# Patient Record
Sex: Female | Born: 1935 | Race: Black or African American | Hispanic: No | Smoking: Never smoker
Health system: Southern US, Community
[De-identification: ages and names within clinical notes are randomized; demographics above are authoritative.]

## PROBLEM LIST (undated history)

## (undated) DIAGNOSIS — E119 Type 2 diabetes mellitus without complications: Secondary | ICD-10-CM

## (undated) DIAGNOSIS — G4730 Sleep apnea, unspecified: Secondary | ICD-10-CM

## (undated) HISTORY — DX: Sleep apnea, unspecified: G47.30

## (undated) HISTORY — PX: PARTIAL HYSTERECTOMY: SHX80

## (undated) HISTORY — PX: BREAST EXCISIONAL BIOPSY: SUR124

## (undated) HISTORY — DX: Type 2 diabetes mellitus without complications: E11.9

## (undated) HISTORY — PX: KNEE SURGERY: SHX244

## (undated) HISTORY — PX: HERNIA REPAIR: SHX51

## (undated) HISTORY — PX: WRIST SURGERY: SHX841

---

## 2004-10-30 ENCOUNTER — Ambulatory Visit: Payer: Self-pay | Admitting: Unknown Physician Specialty

## 2004-12-27 ENCOUNTER — Ambulatory Visit: Payer: Self-pay | Admitting: Internal Medicine

## 2006-05-29 ENCOUNTER — Ambulatory Visit: Payer: Self-pay | Admitting: Internal Medicine

## 2007-03-02 ENCOUNTER — Other Ambulatory Visit: Payer: Self-pay

## 2007-03-02 ENCOUNTER — Emergency Department: Payer: Self-pay | Admitting: Emergency Medicine

## 2007-09-11 ENCOUNTER — Ambulatory Visit: Payer: Self-pay | Admitting: Internal Medicine

## 2008-09-16 ENCOUNTER — Ambulatory Visit: Payer: Self-pay | Admitting: Internal Medicine

## 2009-08-30 ENCOUNTER — Ambulatory Visit: Payer: Self-pay | Admitting: Cardiology

## 2010-04-03 ENCOUNTER — Ambulatory Visit: Payer: Self-pay | Admitting: Internal Medicine

## 2010-06-08 ENCOUNTER — Ambulatory Visit: Payer: Self-pay | Admitting: Unknown Physician Specialty

## 2010-06-12 LAB — PATHOLOGY REPORT

## 2010-10-09 ENCOUNTER — Ambulatory Visit: Payer: Self-pay | Admitting: Internal Medicine

## 2010-11-07 ENCOUNTER — Ambulatory Visit: Payer: Self-pay | Admitting: Pain Medicine

## 2010-11-15 ENCOUNTER — Ambulatory Visit: Payer: Self-pay | Admitting: Pain Medicine

## 2010-12-12 ENCOUNTER — Ambulatory Visit: Payer: Self-pay | Admitting: Pain Medicine

## 2010-12-20 ENCOUNTER — Ambulatory Visit: Payer: Self-pay | Admitting: Pain Medicine

## 2011-01-18 ENCOUNTER — Ambulatory Visit: Payer: Self-pay | Admitting: Pain Medicine

## 2011-01-31 ENCOUNTER — Ambulatory Visit: Payer: Self-pay | Admitting: Pain Medicine

## 2011-03-08 ENCOUNTER — Ambulatory Visit: Payer: Self-pay | Admitting: Pain Medicine

## 2011-04-26 ENCOUNTER — Ambulatory Visit: Payer: Self-pay | Admitting: Pain Medicine

## 2011-05-29 ENCOUNTER — Ambulatory Visit: Payer: Self-pay | Admitting: Pain Medicine

## 2011-09-20 ENCOUNTER — Ambulatory Visit: Payer: Self-pay | Admitting: Internal Medicine

## 2013-01-20 ENCOUNTER — Ambulatory Visit: Payer: Self-pay | Admitting: Internal Medicine

## 2013-08-28 ENCOUNTER — Ambulatory Visit: Payer: Self-pay | Admitting: Internal Medicine

## 2013-09-19 ENCOUNTER — Ambulatory Visit: Payer: Self-pay | Admitting: Internal Medicine

## 2013-10-19 ENCOUNTER — Ambulatory Visit: Payer: Self-pay | Admitting: Internal Medicine

## 2014-01-22 ENCOUNTER — Ambulatory Visit: Payer: Self-pay | Admitting: Podiatry

## 2014-02-05 ENCOUNTER — Ambulatory Visit: Payer: Self-pay | Admitting: Podiatry

## 2014-03-02 ENCOUNTER — Encounter: Payer: Self-pay | Admitting: Podiatry

## 2014-03-02 ENCOUNTER — Ambulatory Visit (INDEPENDENT_AMBULATORY_CARE_PROVIDER_SITE_OTHER): Payer: No Typology Code available for payment source

## 2014-03-02 ENCOUNTER — Ambulatory Visit: Payer: No Typology Code available for payment source | Admitting: Podiatry

## 2014-03-02 VITALS — Resp 16 | Ht 64.0 in | Wt 260.0 lb

## 2014-03-02 DIAGNOSIS — Q828 Other specified congenital malformations of skin: Secondary | ICD-10-CM

## 2014-03-02 DIAGNOSIS — M79609 Pain in unspecified limb: Secondary | ICD-10-CM

## 2014-03-02 DIAGNOSIS — B351 Tinea unguium: Secondary | ICD-10-CM

## 2014-03-02 DIAGNOSIS — M79673 Pain in unspecified foot: Secondary | ICD-10-CM

## 2014-03-02 DIAGNOSIS — M2040 Other hammer toe(s) (acquired), unspecified foot: Secondary | ICD-10-CM

## 2014-03-02 DIAGNOSIS — E1159 Type 2 diabetes mellitus with other circulatory complications: Secondary | ICD-10-CM

## 2014-03-02 NOTE — Progress Notes (Signed)
   Subjective:    Patient ID: Jackie Tucker, female    DOB: 06/09/1934, 80 y.o.   MRN: [id number]  HPI Comments: N pain L trim toenails D yrs O slowly C worse A growing out T pt has someone to trim toenails    N 0 L 3RD DIGIT LEFT D ? O SLOWLY C WORSE A SHOES T NEOSPORIN, PEROXIDE, SOAKS IN WATER     Review of Systems  All other systems reviewed and are negative.      Objective:   Physical Exam        Assessment & Plan:

## 2014-03-03 ENCOUNTER — Encounter: Payer: Self-pay | Admitting: Podiatry

## 2014-03-03 NOTE — Progress Notes (Signed)
Subjective:     Patient ID: Jackie Tucker, female   DOB: 08/06/1934, 80 y.o.   MRN: [id number]  HPI patient presents for nail care and lesion third digit left become painful. She is a long-term diabetic and has had history of digital deformities and has at risk environment   Review of Systems     Objective:   Physical Exam Neurovascular status unchanged with thick nailbeds 1-5 both feet that are painful in keratotic lesion distal third left it's painful along with hammertoe deformity and diminishment of sharp toe vibratory and venous congestion noted within the ankle both feet along with diminished      Assessment:     At risk for diabetic with nail disease that's painful lesion third left and pretty ulcerated type environment    Plan:     Debridement of nailbeds 1-5 both feet with no iatrogenic bleeding noted and agreed lesion distal third left with loose and core with no bleeding noted and we will get authorization for diabetic shoes to help prevent further issues with this patient. Has worn them in the past and they have been helpful in preventing problems

## 2014-03-19 ENCOUNTER — Telehealth: Payer: Self-pay | Admitting: *Deleted

## 2014-03-19 NOTE — Telephone Encounter (Signed)
Called and left message with gentleman asking him to tell Huma to call dr regals office. Pt needs to make appt for diabetic shoes. Was approved by her doctor to have diabetic shoes.

## 2014-07-02 ENCOUNTER — Ambulatory Visit: Payer: No Typology Code available for payment source | Admitting: Podiatry

## 2014-07-21 ENCOUNTER — Other Ambulatory Visit: Payer: Self-pay | Admitting: Orthopedic Surgery

## 2014-07-21 DIAGNOSIS — M545 Low back pain, unspecified: Secondary | ICD-10-CM

## 2014-07-31 ENCOUNTER — Ambulatory Visit
Admission: RE | Admit: 2014-07-31 | Discharge: 2014-07-31 | Disposition: A | Discharge status: Home / Self Care | Payer: Medicare Other | Source: Ambulatory Visit | Attending: Orthopedic Surgery | Admitting: Orthopedic Surgery

## 2014-07-31 DIAGNOSIS — M545 Low back pain, unspecified: Secondary | ICD-10-CM

## 2014-11-30 ENCOUNTER — Ambulatory Visit: Payer: Self-pay | Admitting: Internal Medicine

## 2015-05-24 ENCOUNTER — Encounter: Payer: Medicare Other | Attending: Internal Medicine | Admitting: Respiratory Therapy

## 2015-05-24 VITALS — Ht 65.0 in | Wt 258.7 lb

## 2015-05-24 DIAGNOSIS — J449 Chronic obstructive pulmonary disease, unspecified: Secondary | ICD-10-CM | POA: Insufficient documentation

## 2015-05-24 DIAGNOSIS — G4730 Sleep apnea, unspecified: Secondary | ICD-10-CM

## 2015-05-24 NOTE — Progress Notes (Signed)
Pulmonary Individual Treatment Plan  Patient Details  Name: Jackie Tucker MRN: [id number] Date of Birth: July 04, 1934 Referring Provider:  Yevonne Pax, MD  Initial Encounter Date: Date: 05/24/15  Visit Diagnosis: COPD, mild  Sleep apnea  Patient's Home Medications on Admission:  Current outpatient prescriptions:  .  ALLOPURINOL PO, Take by mouth daily., Disp: , Rfl:  .  aspirin 81 MG tablet, Take 81 mg by mouth 2 (two) times daily., Disp: , Rfl:  .  atenolol (TENORMIN) 50 MG tablet, Take 50 mg by mouth daily., Disp: , Rfl:  .  ferrous sulfate 325 (65 FE) MG tablet, Take 325 mg by mouth daily with breakfast., Disp: , Rfl:  .  furosemide (LASIX) 40 MG tablet, Take 40 mg by mouth daily., Disp: , Rfl:  .  gabapentin (NEURONTIN) 300 MG capsule, Take 300 mg by mouth 3 (three) times daily., Disp: , Rfl:  .  losartan (COZAAR) 50 MG tablet, Take 50 mg by mouth daily., Disp: , Rfl:  .  omeprazole (PRILOSEC) 20 MG capsule, Take 20 mg by mouth 2 (two) times daily before a meal., Disp: , Rfl:  .  oxyCODONE-acetaminophen (PERCOCET/ROXICET) 5-325 MG per tablet, Take by mouth 2 (two) times daily., Disp: , Rfl:  .  PARoxetine (PAXIL) 20 MG tablet, Take 20 mg by mouth daily., Disp: , Rfl:   Past Medical History: Past Medical History  Diagnosis Date  . Diabetes   . Sleep apnea     Tobacco Use: History  Smoking status  . Never Smoker   Smokeless tobacco  . Not on file    Labs: Recent Review Flowsheet Data    There is no flowsheet data to display.       ADL UCSD:     ADL UCSD      05/24/15 1330       ADL UCSD   ADL Phase Entry     SOB Score total 40     Rest 0     Walk 0     Stairs 3     Bath 1     Dress 1     Shop 2         Pulmonary Function Assessment:     Pulmonary Function Assessment - 05/24/15 1446    Pulmonary Function Tests   RV% 91 %   DLCO% 58 %   Initial Spirometry Results   FVC% 67 %   FEV1% 79 %   FEV1/FVC Ratio 90   Post Bronchodilator  Spirometry Results   FVC% 81 %   FEV1% 93 %   FEV1/FVC Ratio 87   Breath   Shortness of Breath Yes;Fear of Shortness of Breath;Limiting activity      Exercise Target Goals: Date: 05/24/15  Exercise Program Goal: Individual exercise prescription set with THRR, safety & activity barriers. Participant demonstrates ability to understand and report RPE using BORG scale, to self-measure pulse accurately, and to acknowledge the importance of the exercise prescription.  Exercise Prescription Goal: Starting with aerobic activity 30 plus minutes a day, 3 days per week for initial exercise prescription. Provide home exercise prescription and guidelines that participant acknowledges understanding prior to discharge.  Activity Barriers & Risk Stratification:     Activity Barriers & Risk Stratification - 05/24/15 1330    Activity Barriers & Risk Stratification   Activity Barriers Shortness of Breath;Back Problems   Risk Stratification Low      6 Minute Walk:     6 Minute Walk  05/24/15 1526       6 Minute Walk   Phase Initial     Distance 400 feet     Walk Time 3 minutes     Resting HR 75 bpm     Resting BP 152/78 mmHg     Max Ex. HR 89 bpm     Max Ex. BP 222/72 mmHg     RPE 18     Perceived Dyspnea  6     Symptoms No        Initial Exercise Prescription:     Initial Exercise Prescription - 05/24/15 1500    Date of Initial Exercise Prescription   Date 05/24/15   Treadmill   MPH 1   Grade 0   Minutes 10   Recumbant Bike   Level 1   RPM 30   Watts 20   Minutes 10   NuStep   Level 2   Watts 40   Minutes 10   Arm Ergometer   Level 1   Watts 10   Minutes 10   REL-XR   Level 1   Watts 40   Minutes 10   Prescription Details   Frequency (times per week) 3   Duration Progress to 30 minutes of continuous aerobic without signs/symptoms of physical distress   Intensity   THRR REST +  30   Ratings of Perceived Exertion 11-15   Perceived Dyspnea 2-4    Progression Continue progressive overload as per policy without signs/symptoms or physical distress.   Resistance Training   Training Prescription Yes   Weight 1   Reps 10-12      Exercise Prescription Changes:   Discharge Exercise Prescription (Final Exercise Prescription Changes):    Nutrition:  Target Goals: Understanding of nutrition guidelines, daily intake of sodium 1500mg , cholesterol 200mg , calories 30% from fat and 7% or less from saturated fats, daily to have 5 or more servings of fruits and vegetables.  Biometrics:     Pre Biometrics - 05/24/15 1528    Pre Biometrics   Height 5\' 5"  (1.651 m)   Weight 258 lb 11.2 oz (117.346 kg)   Waist Circumference 46 inches   Hip Circumference 51.5 inches   Waist to Hip Ratio 0.89 %   BMI (Calculated) 43.1       Nutrition Therapy Plan and Nutrition Goals:     Nutrition Therapy & Goals - 05/24/15 1330    Nutrition Therapy   Diet Ms Robb prefers not to meet with the dietitian; she cooks healthy with vegetables, no salt; her goal is to weigh 175lbs      Nutrition Discharge: Rate Your Plate Scores:   Psychosocial: Target Goals: Acknowledge presence or absence of depression, maximize coping skills, provide positive support system. Participant is able to verbalize types and ability to use techniques and skills needed for reducing stress and depression.  Initial Review & Psychosocial Screening:     Initial Psych Review & Screening - 05/24/15 1330    Initial Review   Current issues with History of Depression   Family Dynamics   Good Support System? Yes   Comments Ms Kamphuis is very pleasant lady. She has been with a partner for 29 years. Her first husband died in the 57's. She has 2 children and a third child who was killed by her exboyfriend years ago. This has been very difficult for her .   Barriers   Psychosocial barriers to participate in program The patient should benefit from training in  stress management and  relaxation.   Screening Interventions   Interventions Encouraged to exercise;Program counselor consult      Quality of Life Scores:     Quality of Life - 05/24/15 1330    Quality of Life Scores   Health/Function Pre 9.6 %   Socioeconomic Pre 5.14 %   Psych/Spiritual Pre 12 %   Family Pre 4.5 %   GLOBAL Pre 8.55 %      PHQ-9:     Recent Review Flowsheet Data    [facility]   Decreased Interest 0   Down, Depressed, Hopeless 0   PHQ - 2 Score 0      Psychosocial Evaluation and Intervention:   Psychosocial Re-Evaluation:  Education: Education Goals: Education classes will be provided on a weekly basis, covering required topics. Participant will state understanding/return demonstration of topics presented.  Learning Barriers/Preferences:     Learning Barriers/Preferences - 05/24/15 1330    Learning Barriers/Preferences   Learning Barriers None   Learning Preferences Group Instruction;Individual Instruction;Pictoral;Skilled Demonstration;Verbal Instruction;Video;Written Material      Education Topics: Initial Evaluation Education: - Verbal, written and demonstration of respiratory meds, RPE/PD scales, oximetry and breathing techniques. Instruction on use of nebulizers and MDIs: cleaning and proper use, rinsing mouth with steroid doses and importance of monitoring MDI activations.          Most Recent Value   Date  05/24/15   Educator  LB   Instruction Review Code  2- meets goals/outcomes      General Nutrition Guidelines/Fats and Fiber: -Group instruction provided by verbal, written material, models and posters to present the general guidelines for heart healthy nutrition. Gives an explanation and review of dietary fats and fiber.   Controlling Sodium/Reading Food Labels: -Group verbal and written material supporting the discussion of sodium use in heart healthy nutrition. Review and explanation with models, verbal and written  materials for utilization of the food label.   Exercise Physiology & Risk Factors: - Group verbal and written instruction with models to review the exercise physiology of the cardiovascular system and associated critical values. Details cardiovascular disease risk factors and the goals associated with each risk factor.   Aerobic Exercise & Resistance Training: - Gives group verbal and written discussion on the health impact of inactivity. On the components of aerobic and resistive training programs and the benefits of this training and how to safely progress through these programs.   Flexibility, Balance, General Exercise Guidelines: - Provides group verbal and written instruction on the benefits of flexibility and balance training programs. Provides general exercise guidelines with specific guidelines to those with heart or lung disease. Demonstration and skill practice provided.   Stress Management: - Provides group verbal and written instruction about the health risks of elevated stress, cause of high stress, and healthy ways to reduce stress.   Depression: - Provides group verbal and written instruction on the correlation between heart/lung disease and depressed mood, treatment options, and the stigmas associated with seeking treatment.   Exercise & Equipment Safety: - Individual verbal instruction and demonstration of equipment use and safety with use of the equipment.   Infection Prevention: - Provides verbal and written material to individual with discussion of infection control including proper hand washing and proper equipment cleaning during exercise session.   Falls Prevention: - Provides verbal and written material to individual with discussion of falls prevention and safety.      Most Recent Value   Date  05/24/15  Educator  LB   Instruction Review Code  2- meets goals/outcomes      Diabetes: - Individual verbal and written instruction to review signs/symptoms  of diabetes, desired ranges of glucose level fasting, after meals and with exercise. Advice that pre and post exercise glucose checks will be done for 3 sessions at entry of program.   Chronic Lung Diseases: - Group verbal and written instruction to review new updates, new respiratory medications, new advancements in procedures and treatments. Provide informative websites and "800" numbers of self-education.   Lung Procedures: - Group verbal and written instruction to describe testing methods done to diagnose lung disease. Review the outcome of test results. Describe the treatment choices: Pulmonary Function Tests, ABGs and oximetry.   Energy Conservation: - Provide group verbal and written instruction for methods to conserve energy, plan and organize activities. Instruct on pacing techniques, use of adaptive equipment and posture/positioning to relieve shortness of breath.   Triggers: - Group verbal and written instruction to review types of environmental controls: home humidity, furnaces, filters, dust mite/pet prevention, HEPA vacuums. To discuss weather changes, air quality and the benefits of nasal washing.   Exacerbations: - Group verbal and written instruction to provide: warning signs, infection symptoms, calling MD promptly, preventive modes, and value of vaccinations. Review: effective airway clearance, coughing and/or vibration techniques. Create an Sport and exercise psychologist.   Oxygen: - Individual and group verbal and written instruction on oxygen therapy. Includes supplement oxygen, available portable oxygen systems, continuous and intermittent flow rates, oxygen safety, concentrators, and Medicare reimbursement for oxygen.      Most Recent Value   Date  05/24/15   Educator  LB   Instruction Review Code  2- meets goals/outcomes [Oxygen at night with CPAP]      Respiratory Medications: - Group verbal and written instruction to review medications for lung disease. Drug class, frequency,  complications, importance of spacers, rinsing mouth after steroid MDI's, and proper cleaning methods for nebulizers.      Most Recent Value   Date  05/24/15   Educator  LB   Instruction Review Code  2- meets goals/outcomes      AED/CPR: - Group verbal and written instruction with the use of models to demonstrate the basic use of the AED with the basic ABC's of resuscitation.   Breathing Retraining: - Provides individuals verbal and written instruction on purpose, frequency, and proper technique of diaphragmatic breathing and pursed-lipped breathing. Applies individual practice skills.      Most Recent Value   Date  05/24/15   Educator  LB   Instruction Review Code  2- meets goals/outcomes      Anatomy and Physiology of the Lungs: - Group verbal and written instruction with the use of models to provide basic lung anatomy and physiology related to function, structure and complications of lung disease.   Heart Failure: - Group verbal and written instruction on the basics of heart failure: signs/symptoms, treatments, explanation of ejection fraction, enlarged heart and cardiomyopathy.   Sleep Apnea: - Individual verbal and written instruction to review Obstructive Sleep Apnea. Review of risk factors, methods for diagnosing and types of masks and machines for OSA.      Most Recent Value   Date  05/24/15   Educator  LB   Instruction Review Code  2- meets goals/outcomes [Full face mask with humidity]      Anxiety: - Provides group, verbal and written instruction on the correlation between heart/lung disease and anxiety, treatment options, and management  of anxiety.   Relaxation: - Provides group, verbal and written instruction about the benefits of relaxation for patients with heart/lung disease. Also provides patients with examples of relaxation techniques.   Knowledge Questionnaire Score:     Knowledge Questionnaire Score - 05/24/15 1330    Knowledge Questionnaire Score    Pre Score -7      Personal Goals and Risk Factors at Admission:     Personal Goals and Risk Factors at Admission - 05/24/15 1330    Personal Goals and Risk Factors on Admission    Weight Management Yes   Intervention Learn and follow the exercise and diet guidelines while in the program. Utilize the nutrition and education classes to help gain knowledge of the diet and exercise expectations in the program  Ms Coverdale prefers not to meet with the dietitian; she cooks a lot of vegetables, no salt, and likes apples and peaches.   Admit Weight 258 lb 11.2 oz (117.346 kg)   Goal Weight 175 lb (79.379 kg)   Increase Aerobic Exercise and Physical Activity Yes   Intervention While in program, learn and follow the exercise prescription taught. Start at a low level workload and increase workload after able to maintain previous level for 30 minutes. Increase time before increasing intensity.  Ms Riordan has a home treadmill and stationary bike.   Understand more about Heart/Pulmonary Disease. Yes   Intervention While in program utilize professionals for any questions, and attend the education sessions. Great websites to use are www.americanheart.org or www.lung.org for reliable information.   Improve shortness of breath with ADL's Yes   Intervention While in program, learn and follow the exercise prescription taught. Start at a low level workload and increase workload ad advised by the exercise physiologist. Increase time before increasing intensity.   Develop more efficient breathing techniques such as purse lipped breathing and diaphragmatic breathing; and practicing self-pacing with activity Yes   Intervention While in program, learn and utilize the specific breathing techniques taught to you. Continue to practice and use the techniques as needed.   Increase knowledge of respiratory medications and ability to use respiratory devices properly.  Yes   Intervention While in program, learn to administer  MDI, nebulizer, and spacer properly.;Learn to take respiratory medicine as ordered.;While in program, learn to Clean MDI, nebulizers, and spacers properly.  Ms Obriant takes ProAir and Virgel Bouquet; a spacer was given to her for her ProAir.   Diabetes Yes   Goal Blood glucose control identified by blood glucose values, HgbA1C. Participant verbalizes understanding of the signs/symptoms of hyper/hypo glycemia, proper foot care and importance of medication and nutrition plan for blood glucose control.   Intervention Provide nutrition & aerobic exercise along with prescribed medications to achieve blood glucose in normal ranges: Fasting 65-99 mg/dL   Hypertension Yes   Goal Participant will see blood pressure controlled within the values of 140/6mm/Hg or within value directed by their physician.   Intervention Provide nutrition & aerobic exercise along with prescribed medications to achieve BP 140/90 or less.   Lipids Yes   Goal Cholesterol controlled with medications as prescribed, with individualized exercise RX and with personalized nutrition plan. Value goals: LDL < , HDL > . Participant states understanding of desired cholesterol values and following prescriptions.   Intervention Provide nutrition & aerobic exercise along with prescribed medications to achieve LDL 70mg , HDL >40mg .      Personal Goals and Risk Factors Review:    Personal Goals Discharge:    Comments:

## 2015-05-24 NOTE — Patient Instructions (Signed)
Patient Instructions  Patient Details  Name: Jackie Tucker MRN: [id number] Date of Birth: 1933-12-31 Referring Provider:  Yevonne Pax, MD  Below are the personal goals you chose as well as exercise and nutrition goals. Our goal is to help you keep on track towards obtaining and maintaining your goals. We will be discussing your progress on these goals with you throughout the program.  Initial Exercise Prescription:     Initial Exercise Prescription - 05/24/15 1500    Date of Initial Exercise Prescription   Date 05/24/15   Treadmill   MPH 1   Grade 0   Minutes 10   Recumbant Bike   Level 1   RPM 30   Watts 20   Minutes 10   NuStep   Level 2   Watts 40   Minutes 10   Arm Ergometer   Level 1   Watts 10   Minutes 10   REL-XR   Level 1   Watts 40   Minutes 10   Prescription Details   Frequency (times per week) 3   Duration Progress to 30 minutes of continuous aerobic without signs/symptoms of physical distress   Intensity   THRR REST +  30   Ratings of Perceived Exertion 11-15   Perceived Dyspnea 2-4   Progression Continue progressive overload as per policy without signs/symptoms or physical distress.   Resistance Training   Training Prescription Yes   Weight 1   Reps 10-12      Exercise Goals: Frequency: Be able to perform aerobic exercise three times per week working toward 3-5 days per week.  Intensity: Work with a perceived exertion of 11 (fairly light) - 15 (hard) as tolerated. Follow your new exercise prescription and watch for changes in prescription as you progress with the program. Changes will be reviewed with you when they are made.  Duration: You should be able to do 30 minutes of continuous aerobic exercise in addition to a 5 minute warm-up and a 5 minute cool-down routine.  Nutrition Goals: Your personal nutrition goals will be established when you do your nutrition analysis with the dietician.  The following are nutrition guidelines to  follow: Cholesterol < /day Sodium < /day Fiber: Women over 50 yrs - 21 grams per day  Personal Goals:     Personal Goals and Risk Factors at Admission - 05/24/15 1330    Personal Goals and Risk Factors on Admission    Weight Management Yes   Intervention Learn and follow the exercise and diet guidelines while in the program. Utilize the nutrition and education classes to help gain knowledge of the diet and exercise expectations in the program  Jackie Tucker prefers not to meet with the dietitian; she cooks a lot of vegetables, no salt, and likes apples and peaches.   Admit Weight 258 lb 11.2 oz (117.346 kg)   Goal Weight 175 lb (79.379 kg)   Increase Aerobic Exercise and Physical Activity Yes   Intervention While in program, learn and follow the exercise prescription taught. Start at a low level workload and increase workload after able to maintain previous level for 30 minutes. Increase time before increasing intensity.  Jackie Tucker has a home treadmill and stationary bike.   Understand more about Heart/Pulmonary Disease. Yes   Intervention While in program utilize professionals for any questions, and attend the education sessions. Great websites to use are www.americanheart.org or www.lung.org for reliable information.   Improve shortness of breath with ADL's Yes  Intervention While in program, learn and follow the exercise prescription taught. Start at a low level workload and increase workload ad advised by the exercise physiologist. Increase time before increasing intensity.   Develop more efficient breathing techniques such as purse lipped breathing and diaphragmatic breathing; and practicing self-pacing with activity Yes   Intervention While in program, learn and utilize the specific breathing techniques taught to you. Continue to practice and use the techniques as needed.   Increase knowledge of respiratory medications and ability to use respiratory devices properly.  Yes    Intervention While in program, learn to administer MDI, nebulizer, and spacer properly.;Learn to take respiratory medicine as ordered.;While in program, learn to Clean MDI, nebulizers, and spacers properly.  Jackie Tucker takes ProAir and Virgel BouquetBreo; a spacer was given to her for her ProAir.   Diabetes Yes   Goal Blood glucose control identified by blood glucose values, HgbA1C. Participant verbalizes understanding of the signs/symptoms of hyper/hypo glycemia, proper foot care and importance of medication and nutrition plan for blood glucose control.   Intervention Provide nutrition & aerobic exercise along with prescribed medications to achieve blood glucose in normal ranges: Fasting 65-99 mg/dL   Hypertension Yes   Goal Participant will see blood pressure controlled within the values of 140/8090mm/Hg or within value directed by their physician.   Intervention Provide nutrition & aerobic exercise along with prescribed medications to achieve BP 140/90 or less.   Lipids Yes   Goal Cholesterol controlled with medications as prescribed, with individualized exercise RX and with personalized nutrition plan. Value goals: LDL < 70mg , HDL > 40mg . Participant states understanding of desired cholesterol values and following prescriptions.   Intervention Provide nutrition & aerobic exercise along with prescribed medications to achieve LDL 70mg , HDL >40mg .      Tobacco Use Initial Evaluation: History  Smoking status  . Never Smoker   Smokeless tobacco  . Not on file    Copy of goals given to participant.

## 2015-05-24 NOTE — Progress Notes (Signed)
Pulmonary Individual Treatment Plan  Patient Details  Name: Jackie Tucker MRN: [id number] Date of Birth: 20-Jun-1934 Referring Provider:  Yevonne Pax, MD  Initial Encounter Date: 05/24/2015  Visit Diagnosis: COPD, mild  Sleep apnea  Patient's Home Medications on Admission:  Current outpatient prescriptions:    ALLOPURINOL PO, Take by mouth daily., Disp: , Rfl:    aspirin 81 MG tablet, Take 81 mg by mouth 2 (two) times daily., Disp: , Rfl:    atenolol (TENORMIN) 50 MG tablet, Take 50 mg by mouth daily., Disp: , Rfl:    ferrous sulfate 325 (65 FE) MG tablet, Take 325 mg by mouth daily with breakfast., Disp: , Rfl:    furosemide (LASIX) 40 MG tablet, Take 40 mg by mouth daily., Disp: , Rfl:    gabapentin (NEURONTIN) 300 MG capsule, Take 300 mg by mouth 3 (three) times daily., Disp: , Rfl:    losartan (COZAAR) 50 MG tablet, Take 50 mg by mouth daily., Disp: , Rfl:    omeprazole (PRILOSEC) 20 MG capsule, Take 20 mg by mouth 2 (two) times daily before a meal., Disp: , Rfl:    oxyCODONE-acetaminophen (PERCOCET/ROXICET) 5-325 MG per tablet, Take by mouth 2 (two) times daily., Disp: , Rfl:    PARoxetine (PAXIL) 20 MG tablet, Take 20 mg by mouth daily., Disp: , Rfl:   Past Medical History: Past Medical History  Diagnosis Date   Diabetes    Sleep apnea     Tobacco Use: History  Smoking status   Never Smoker   Smokeless tobacco   Not on file    Labs: Recent Review Flowsheet Data    There is no flowsheet data to display.       ADL [facility]:     ADL [facility]      05/24/15 1330       ADL [facility]   ADL Phase Entry     SOB Score total 40     Rest 0     Walk 0     Stairs 3     Bath 1     Dress 1     Shop 2         Pulmonary Function Assessment:     Pulmonary Function Assessment - 05/24/15 1446    Pulmonary Function Tests   RV% 91 %   DLCO% 58 %   Initial Spirometry Results   FVC% 67 %   FEV1% 79 %   FEV1/FVC Ratio 90   Post Bronchodilator Spirometry  Results   FVC% 81 %   FEV1% 93 %   FEV1/FVC Ratio 87   Breath   Shortness of Breath Yes;Fear of Shortness of Breath;Limiting activity      Exercise Target Goals:    Exercise Program Goal: Individual exercise prescription set with THRR, safety & activity barriers. Participant demonstrates ability to understand and report RPE using BORG scale, to self-measure pulse accurately, and to acknowledge the importance of the exercise prescription.  Exercise Prescription Goal: Starting with aerobic activity 30 plus minutes a day, 3 days per week for initial exercise prescription. Provide home exercise prescription and guidelines that participant acknowledges understanding prior to discharge.  Activity Barriers & Risk Stratification:     Activity Barriers & Risk Stratification - 05/24/15 1330    Activity Barriers & Risk Stratification   Activity Barriers Shortness of Breath;Back Problems   Risk Stratification Low      6 Minute Walk:   Initial Exercise Prescription:   Exercise Prescription Changes:  Discharge Exercise Prescription (Final Exercise Prescription Changes):    Nutrition:  Target Goals: Understanding of nutrition guidelines, daily intake of sodium 1500mg , cholesterol 200mg , calories 30% from fat and 7% or less from saturated fats, daily to have 5 or more servings of fruits and vegetables.  Biometrics:    Nutrition Therapy Plan and Nutrition Goals:     Nutrition Therapy & Goals - 05/24/15 1330    Nutrition Therapy   Diet Ms Belfield prefers not to meet with the dietitian; she cooks healthy with vegetables, no salt; her goal is to weigh 175lbs      Nutrition Discharge: Rate Your Plate Scores:   Psychosocial: Target Goals: Acknowledge presence or absence of depression, maximize coping skills, provide positive support system. Participant is able to verbalize types and ability to use techniques and skills needed for reducing stress and depression.  Initial  Review & Psychosocial Screening:     Initial Psych Review & Screening - 05/24/15 1330    Initial Review   Current issues with History of Depression   Family Dynamics   Good Support System? Yes   Comments Ms Rasheed is very pleasant lady. She has been with a partner for 29 years. Her first husband died in the 78's. She has 2 children and a third child who was killed by her exboyfriend years ago. This has been very difficult for her .   Barriers   Psychosocial barriers to participate in program The patient should benefit from training in stress management and relaxation.   Screening Interventions   Interventions Encouraged to exercise;Program counselor consult      Quality of Life Scores:     Quality of Life - 05/24/15 1330    Quality of Life Scores   Health/Function Pre 9.6 %   Socioeconomic Pre 5.14 %   Psych/Spiritual Pre 12 %   Family Pre 4.5 %   GLOBAL Pre 8.55 %      PHQ-9:     Recent Review Flowsheet Data    Depression screen [facility] 2/9 05/24/2015   Decreased Interest 0   Down, Depressed, Hopeless 0   PHQ - 2 Score 0      Psychosocial Evaluation and Intervention:   Psychosocial Re-Evaluation:  Education: Education Goals: Education classes will be provided on a weekly basis, covering required topics. Participant will state understanding/return demonstration of topics presented.  Learning Barriers/Preferences:     Learning Barriers/Preferences - 05/24/15 1330    Learning Barriers/Preferences   Learning Barriers None   Learning Preferences Group Instruction;Individual Instruction;Pictoral;Skilled Demonstration;Verbal Instruction;Video;Written Material      Education Topics: Initial Evaluation Education: - Verbal, written and demonstration of respiratory meds, RPE/PD scales, oximetry and breathing techniques. Instruction on use of nebulizers and MDIs: cleaning and proper use, rinsing mouth with steroid doses and importance of monitoring MDI activations.           Most Recent Value   Date  05/24/15   Educator  LB   Instruction Review Code  2- meets goals/outcomes      General Nutrition Guidelines/Fats and Fiber: -Group instruction provided by verbal, written material, models and posters to present the general guidelines for heart healthy nutrition. Gives an explanation and review of dietary fats and fiber.   Controlling Sodium/Reading Food Labels: -Group verbal and written material supporting the discussion of sodium use in heart healthy nutrition. Review and explanation with models, verbal and written materials for utilization of the food label.   Exercise Physiology & Risk Factors: - Group verbal  and written instruction with models to review the exercise physiology of the cardiovascular system and associated critical values. Details cardiovascular disease risk factors and the goals associated with each risk factor.   Aerobic Exercise & Resistance Training: - Gives group verbal and written discussion on the health impact of inactivity. On the components of aerobic and resistive training programs and the benefits of this training and how to safely progress through these programs.   Flexibility, Balance, General Exercise Guidelines: - Provides group verbal and written instruction on the benefits of flexibility and balance training programs. Provides general exercise guidelines with specific guidelines to those with heart or lung disease. Demonstration and skill practice provided.   Stress Management: - Provides group verbal and written instruction about the health risks of elevated stress, cause of high stress, and healthy ways to reduce stress.   Depression: - Provides group verbal and written instruction on the correlation between heart/lung disease and depressed mood, treatment options, and the stigmas associated with seeking treatment.   Exercise & Equipment Safety: - Individual verbal instruction and demonstration of equipment use and  safety with use of the equipment.   Infection Prevention: - Provides verbal and written material to individual with discussion of infection control including proper hand washing and proper equipment cleaning during exercise session.   Falls Prevention: - Provides verbal and written material to individual with discussion of falls prevention and safety.      Most Recent Value   Date  05/24/15   Educator  LB   Instruction Review Code  2- meets goals/outcomes      Diabetes: - Individual verbal and written instruction to review signs/symptoms of diabetes, desired ranges of glucose level fasting, after meals and with exercise. Advice that pre and post exercise glucose checks will be done for 3 sessions at entry of program.   Chronic Lung Diseases: - Group verbal and written instruction to review new updates, new respiratory medications, new advancements in procedures and treatments. Provide informative websites and "800" numbers of self-education.   Lung Procedures: - Group verbal and written instruction to describe testing methods done to diagnose lung disease. Review the outcome of test results. Describe the treatment choices: Pulmonary Function Tests, ABGs and oximetry.   Energy Conservation: - Provide group verbal and written instruction for methods to conserve energy, plan and organize activities. Instruct on pacing techniques, use of adaptive equipment and posture/positioning to relieve shortness of breath.   Triggers: - Group verbal and written instruction to review types of environmental controls: home humidity, furnaces, filters, dust mite/pet prevention, HEPA vacuums. To discuss weather changes, air quality and the benefits of nasal washing.   Exacerbations: - Group verbal and written instruction to provide: warning signs, infection symptoms, calling MD promptly, preventive modes, and value of vaccinations. Review: effective airway clearance, coughing and/or vibration  techniques. Create an Sport and exercise psychologistAction Plan.   Oxygen: - Individual and group verbal and written instruction on oxygen therapy. Includes supplement oxygen, available portable oxygen systems, continuous and intermittent flow rates, oxygen safety, concentrators, and Medicare reimbursement for oxygen.      Most Recent Value   Date  05/24/15   Educator  LB   Instruction Review Code  2- meets goals/outcomes [Oxygen at night with CPAP]      Respiratory Medications: - Group verbal and written instruction to review medications for lung disease. Drug class, frequency, complications, importance of spacers, rinsing mouth after steroid MDI's, and proper cleaning methods for nebulizers.      Most Recent  Value   Date  05/24/15   Educator  LB   Instruction Review Code  2- meets goals/outcomes      AED/CPR: - Group verbal and written instruction with the use of models to demonstrate the basic use of the AED with the basic ABC's of resuscitation.   Breathing Retraining: - Provides individuals verbal and written instruction on purpose, frequency, and proper technique of diaphragmatic breathing and pursed-lipped breathing. Applies individual practice skills.      Most Recent Value   Date  05/24/15   Educator  LB   Instruction Review Code  2- meets goals/outcomes      Anatomy and Physiology of the Lungs: - Group verbal and written instruction with the use of models to provide basic lung anatomy and physiology related to function, structure and complications of lung disease.   Heart Failure: - Group verbal and written instruction on the basics of heart failure: signs/symptoms, treatments, explanation of ejection fraction, enlarged heart and cardiomyopathy.   Sleep Apnea: - Individual verbal and written instruction to review Obstructive Sleep Apnea. Review of risk factors, methods for diagnosing and types of masks and machines for OSA.      Most Recent Value   Date  05/24/15   Educator  LB    Instruction Review Code  2- meets goals/outcomes [Full face mask with humidity]      Anxiety: - Provides group, verbal and written instruction on the correlation between heart/lung disease and anxiety, treatment options, and management of anxiety.   Relaxation: - Provides group, verbal and written instruction about the benefits of relaxation for patients with heart/lung disease. Also provides patients with examples of relaxation techniques.   Knowledge Questionnaire Score:     Knowledge Questionnaire Score - 05/24/15 1330    Knowledge Questionnaire Score   Pre Score -7      Personal Goals and Risk Factors at Admission:     Personal Goals and Risk Factors at Admission - 05/24/15 1330    Personal Goals and Risk Factors on Admission    Weight Management Yes   Intervention Learn and follow the exercise and diet guidelines while in the program. Utilize the nutrition and education classes to help gain knowledge of the diet and exercise expectations in the program  Ms Ourada prefers not to meet with the dietitian; she cooks a lot of vegetables, no salt, and likes apples and peaches.   Admit Weight 258 lb 11.2 oz (117.346 kg)   Goal Weight 175 lb (79.379 kg)   Increase Aerobic Exercise and Physical Activity Yes   Intervention While in program, learn and follow the exercise prescription taught. Start at a low level workload and increase workload after able to maintain previous level for 30 minutes. Increase time before increasing intensity.  Ms Tague has a home treadmill and stationary bike.   Understand more about Heart/Pulmonary Disease. Yes   Intervention While in program utilize professionals for any questions, and attend the education sessions. Great websites to use are www.americanheart.org or www.lung.org for reliable information.   Improve shortness of breath with ADL's Yes   Intervention While in program, learn and follow the exercise prescription taught. Start at a low level  workload and increase workload ad advised by the exercise physiologist. Increase time before increasing intensity.   Develop more efficient breathing techniques such as purse lipped breathing and diaphragmatic breathing; and practicing self-pacing with activity Yes   Intervention While in program, learn and utilize the specific breathing techniques taught to you.  Continue to practice and use the techniques as needed.   Increase knowledge of respiratory medications and ability to use respiratory devices properly.  Yes   Intervention While in program, learn to administer MDI, nebulizer, and spacer properly.;Learn to take respiratory medicine as ordered.;While in program, learn to Clean MDI, nebulizers, and spacers properly.  Ms Molner takes ProAir and Virgel Bouquet; a spacer was given to her for her ProAir.   Diabetes Yes   Goal Blood glucose control identified by blood glucose values, HgbA1C. Participant verbalizes understanding of the signs/symptoms of hyper/hypo glycemia, proper foot care and importance of medication and nutrition plan for blood glucose control.   Intervention Provide nutrition & aerobic exercise along with prescribed medications to achieve blood glucose in normal ranges: Fasting 65-99 mg/dL   Hypertension Yes   Goal Participant will see blood pressure controlled within the values of 140/50mm/Hg or within value directed by their physician.   Intervention Provide nutrition & aerobic exercise along with prescribed medications to achieve BP 140/90 or less.   Lipids Yes   Goal Cholesterol controlled with medications as prescribed, with individualized exercise RX and with personalized nutrition plan. Value goals: LDL < 70mg , HDL > 40mg . Participant states understanding of desired cholesterol values and following prescriptions.   Intervention Provide nutrition & aerobic exercise along with prescribed medications to achieve LDL 70mg , HDL >40mg .      Personal Goals and Risk Factors Review:     Personal Goals Discharge:    Comments: Ms Hobday plans to start [facility] on 05/30/2015 and will attend 3d/wk.

## 2015-05-25 ENCOUNTER — Encounter: Payer: Self-pay | Admitting: Internal Medicine

## 2015-05-25 DIAGNOSIS — G4730 Sleep apnea, unspecified: Secondary | ICD-10-CM

## 2015-05-25 DIAGNOSIS — J449 Chronic obstructive pulmonary disease, unspecified: Secondary | ICD-10-CM

## 2015-05-25 NOTE — Patient Instructions (Signed)
pul Patient Instructions  Patient Details  Name: Jackie Tucker MRN: [id number] Date of Birth: 01/18/1934 Referring Provider:  Dr Freda MunroSaadat Khan  Below are the personal goals you chose as well as exercise and nutrition goals. Our goal is to help you keep on track towards obtaining and maintaining your goals. We will be discussing your progress on these goals with you throughout the program.  Initial Exercise Prescription:     Initial Exercise Prescription - 05/24/15 1500    Date of Initial Exercise Prescription   Date 05/24/15   Treadmill   MPH 1   Grade 0   Minutes 10   Recumbant Bike   Level 1   RPM 30   Watts 20   Minutes 10   NuStep   Level 2   Watts 40   Minutes 10   Arm Ergometer   Level 1   Watts 10   Minutes 10   REL-XR   Level 1   Watts 40   Minutes 10   Prescription Details   Frequency (times per week) 3   Duration Progress to 30 minutes of continuous aerobic without signs/symptoms of physical distress   Intensity   THRR REST +  30   Ratings of Perceived Exertion 11-15   Perceived Dyspnea 2-4   Progression Continue progressive overload as per policy without signs/symptoms or physical distress.   Resistance Training   Training Prescription Yes   Weight 1   Reps 10-12      Exercise Goals: Frequency: Be able to perform aerobic exercise three times per week working toward 3-5 days per week.  Intensity: Work with a perceived exertion of 11 (fairly light) - 15 (hard) as tolerated. Follow your new exercise prescription and watch for changes in prescription as you progress with the program. Changes will be reviewed with you when they are made.  Duration: You should be able to do 30 minutes of continuous aerobic exercise in addition to a 5 minute warm-up and a 5 minute cool-down routine.  Nutrition Goals: Your personal nutrition goals will be established when you do your nutrition analysis with the dietician.  The following are nutrition guidelines to  follow: Cholesterol < 200mg /day Sodium < 1500mg /day Fiber: Women over 50 yrs - 21 grams per day  Personal Goals:     Personal Goals and Risk Factors at Admission - 05/24/15 1330    Personal Goals and Risk Factors on Admission    Weight Management Yes   Intervention Learn and follow the exercise and diet guidelines while in the program. Utilize the nutrition and education classes to help gain knowledge of the diet and exercise expectations in the program  Jackie Tucker prefers not to meet with the dietitian; she cooks a lot of vegetables, no salt, and likes apples and peaches.   Admit Weight 258 lb 11.2 oz (117.346 kg)   Goal Weight 175 lb (79.379 kg)   Increase Aerobic Exercise and Physical Activity Yes   Intervention While in program, learn and follow the exercise prescription taught. Start at a low level workload and increase workload after able to maintain previous level for 30 minutes. Increase time before increasing intensity.  Jackie Tucker has a home treadmill and stationary bike.   Understand more about Heart/Pulmonary Disease. Yes   Intervention While in program utilize professionals for any questions, and attend the education sessions. Great websites to use are www.americanheart.org or www.lung.org for reliable information.   Improve shortness of breath with ADL's Yes  Intervention While in program, learn and follow the exercise prescription taught. Start at a low level workload and increase workload ad advised by the exercise physiologist. Increase time before increasing intensity.   Develop more efficient breathing techniques such as purse lipped breathing and diaphragmatic breathing; and practicing self-pacing with activity Yes   Intervention While in program, learn and utilize the specific breathing techniques taught to you. Continue to practice and use the techniques as needed.   Increase knowledge of respiratory medications and ability to use respiratory devices properly.  Yes    Intervention While in program, learn to administer MDI, nebulizer, and spacer properly.;Learn to take respiratory medicine as ordered.;While in program, learn to Clean MDI, nebulizers, and spacers properly.  Jackie Cunning takes ProAir and Virgel Bouquet; a spacer was given to her for her ProAir.   Diabetes Yes   Goal Blood glucose control identified by blood glucose values, HgbA1C. Participant verbalizes understanding of the signs/symptoms of hyper/hypo glycemia, proper foot care and importance of medication and nutrition plan for blood glucose control.   Intervention Provide nutrition & aerobic exercise along with prescribed medications to achieve blood glucose in normal ranges: Fasting 65-99 mg/dL   Hypertension Yes   Goal Participant will see blood pressure controlled within the values of 140/9mm/Hg or within value directed by their physician.   Intervention Provide nutrition & aerobic exercise along with prescribed medications to achieve BP 140/90 or less.   Lipids Yes   Goal Cholesterol controlled with medications as prescribed, with individualized exercise RX and with personalized nutrition plan. Value goals: LDL < , HDL > . Participant states understanding of desired cholesterol values and following prescriptions.   Intervention Provide nutrition & aerobic exercise along with prescribed medications to achieve LDL 70mg , HDL >40mg .      Tobacco Use Initial Evaluation: History  Smoking status   Never Smoker   Smokeless tobacco   Not on file    Copy of goals given to participant.

## 2015-05-25 NOTE — Progress Notes (Signed)
Pulmonary Individual Treatment Plan  Patient Details  Name: Jackie Tucker MRN: [id number] Date of Birth: 1934-03-08 Referring Provider:  Dr Freda Munro  Initial Encounter Date: 05/24/15  Visit Diagnosis: COPD, mild  Sleep apnea  Patient's Home Medications on Admission:  Current outpatient prescriptions:    ALLOPURINOL PO, Take by mouth daily., Disp: , Rfl:    aspirin 81 MG tablet, Take 81 mg by mouth 2 (two) times daily., Disp: , Rfl:    atenolol (TENORMIN) 50 MG tablet, Take 50 mg by mouth daily., Disp: , Rfl:    ferrous sulfate 325 (65 FE) MG tablet, Take 325 mg by mouth daily with breakfast., Disp: , Rfl:    furosemide (LASIX) 40 MG tablet, Take 40 mg by mouth daily., Disp: , Rfl:    gabapentin (NEURONTIN) 300 MG capsule, Take 300 mg by mouth 3 (three) times daily., Disp: , Rfl:    losartan (COZAAR) 50 MG tablet, Take 50 mg by mouth daily., Disp: , Rfl:    omeprazole (PRILOSEC) 20 MG capsule, Take 20 mg by mouth 2 (two) times daily before a meal., Disp: , Rfl:    oxyCODONE-acetaminophen (PERCOCET/ROXICET) 5-325 MG per tablet, Take by mouth 2 (two) times daily., Disp: , Rfl:    PARoxetine (PAXIL) 20 MG tablet, Take 20 mg by mouth daily., Disp: , Rfl:   Past Medical History: Past Medical History  Diagnosis Date   Diabetes    Sleep apnea     Tobacco Use: History  Smoking status   Never Smoker   Smokeless tobacco   Not on file    Labs: Recent Review Flowsheet Data    There is no flowsheet data to display.       ADL [facility]:     ADL [facility]      05/24/15 1330       ADL [facility]   ADL Phase Entry     SOB Score total 40     Rest 0     Walk 0     Stairs 3     Bath 1     Dress 1     Shop 2         Pulmonary Function Assessment:     Pulmonary Function Assessment - 05/24/15 1446    Pulmonary Function Tests   RV% 91 %   DLCO% 58 %   Initial Spirometry Results   FVC% 67 %   FEV1% 79 %   FEV1/FVC Ratio 90   Post Bronchodilator Spirometry Results    FVC% 81 %   FEV1% 93 %   FEV1/FVC Ratio 87   Breath   Shortness of Breath Yes;Fear of Shortness of Breath;Limiting activity      Exercise Target Goals:    Exercise Program Goal: Individual exercise prescription set with THRR, safety & activity barriers. Participant demonstrates ability to understand and report RPE using BORG scale, to self-measure pulse accurately, and to acknowledge the importance of the exercise prescription.  Exercise Prescription Goal: Starting with aerobic activity 30 plus minutes a day, 3 days per week for initial exercise prescription. Provide home exercise prescription and guidelines that participant acknowledges understanding prior to discharge.  Activity Barriers & Risk Stratification:     Activity Barriers & Risk Stratification - 05/24/15 1330    Activity Barriers & Risk Stratification   Activity Barriers Shortness of Breath;Back Problems   Risk Stratification Low      6 Minute Walk:     6 Minute Walk  05/24/15 1526       6 Minute Walk   Phase Initial     Distance 400 feet     Walk Time 3 minutes     Resting HR 75 bpm     Resting BP 152/78 mmHg     Max Ex. HR 89 bpm     Max Ex. BP 222/72 mmHg     RPE 18     Perceived Dyspnea  6     Symptoms No        Initial Exercise Prescription:     Initial Exercise Prescription - 05/24/15 1500    Date of Initial Exercise Prescription   Date 05/24/15   Treadmill   MPH 1   Grade 0   Minutes 10   Recumbant Bike   Level 1   RPM 30   Watts 20   Minutes 10   NuStep   Level 2   Watts 40   Minutes 10   Arm Ergometer   Level 1   Watts 10   Minutes 10   REL-XR   Level 1   Watts 40   Minutes 10   Prescription Details   Frequency (times per week) 3   Duration Progress to 30 minutes of continuous aerobic without signs/symptoms of physical distress   Intensity   THRR REST +  30   Ratings of Perceived Exertion 11-15   Perceived Dyspnea 2-4   Progression Continue progressive  overload as per policy without signs/symptoms or physical distress.   Resistance Training   Training Prescription Yes   Weight 1   Reps 10-12      Exercise Prescription Changes:   Discharge Exercise Prescription (Final Exercise Prescription Changes):    Nutrition:  Target Goals: Understanding of nutrition guidelines, daily intake of sodium 1500mg , cholesterol 200mg , calories 30% from fat and 7% or less from saturated fats, daily to have 5 or more servings of fruits and vegetables.  Biometrics:     Pre Biometrics - 05/24/15 1528    Pre Biometrics   Height 5\' 5"  (1.651 m)   Weight 258 lb 11.2 oz (117.346 kg)   Waist Circumference 46 inches   Hip Circumference 51.5 inches   Waist to Hip Ratio 0.89 %   BMI (Calculated) 43.1       Nutrition Therapy Plan and Nutrition Goals:     Nutrition Therapy & Goals - 05/24/15 1330    Nutrition Therapy   Diet Jackie Tucker prefers not to meet with the dietitian; she cooks healthy with vegetables, no salt; her goal is to weigh 175lbs      Nutrition Discharge: Rate Your Plate Scores:   Psychosocial: Target Goals: Acknowledge presence or absence of depression, maximize coping skills, provide positive support system. Participant is able to verbalize types and ability to use techniques and skills needed for reducing stress and depression.  Initial Review & Psychosocial Screening:     Initial Psych Review & Screening - 05/24/15 1330    Initial Review   Current issues with History of Depression   Family Dynamics   Good Support System? Yes   Comments Jackie Tucker is very pleasant lady. She has been with a partner for 29 years. Her first husband died in the 39's. She has 2 children and a third child who was killed by her exboyfriend years ago. This has been very difficult for her .   Barriers   Psychosocial barriers to participate in program The patient should benefit from training in  stress management and relaxation.   Screening  Interventions   Interventions Encouraged to exercise;Program counselor consult      Quality of Life Scores:     Quality of Life - 05/24/15 1330    Quality of Life Scores   Health/Function Pre 9.6 %   Socioeconomic Pre 5.14 %   Psych/Spiritual Pre 12 %   Family Pre 4.5 %   GLOBAL Pre 8.55 %      PHQ-9:     Recent Review Flowsheet Data    [facility] 05/24/2015   Decreased Interest 0   Down, Depressed, Hopeless 0   PHQ - 2 Score 0      Psychosocial Evaluation and Intervention:   Psychosocial Re-Evaluation:  Education: Education Goals: Education classes will be provided on a weekly basis, covering required topics. Participant will state understanding/return demonstration of topics presented.  Learning Barriers/Preferences:     Learning Barriers/Preferences - 05/24/15 1330    Learning Barriers/Preferences   Learning Barriers None   Learning Preferences Group Instruction;Individual Instruction;Pictoral;Skilled Demonstration;Verbal Instruction;Video;Written Material      Education Topics: Initial Evaluation Education: - Verbal, written and demonstration of respiratory meds, RPE/PD scales, oximetry and breathing techniques. Instruction on use of nebulizers and MDIs: cleaning and proper use, rinsing mouth with steroid doses and importance of monitoring MDI activations.   General Nutrition Guidelines/Fats and Fiber: -Group instruction provided by verbal, written material, models and posters to present the general guidelines for heart healthy nutrition. Gives an explanation and review of dietary fats and fiber.   Controlling Sodium/Reading Food Labels: -Group verbal and written material supporting the discussion of sodium use in heart healthy nutrition. Review and explanation with models, verbal and written materials for utilization of the food label.   Exercise Physiology & Risk Factors: - Group verbal and written instruction with models to review the  exercise physiology of the cardiovascular system and associated critical values. Details cardiovascular disease risk factors and the goals associated with each risk factor.   Aerobic Exercise & Resistance Training: - Gives group verbal and written discussion on the health impact of inactivity. On the components of aerobic and resistive training programs and the benefits of this training and how to safely progress through these programs.   Flexibility, Balance, General Exercise Guidelines: - Provides group verbal and written instruction on the benefits of flexibility and balance training programs. Provides general exercise guidelines with specific guidelines to those with heart or lung disease. Demonstration and skill practice provided.   Stress Management: - Provides group verbal and written instruction about the health risks of elevated stress, cause of high stress, and healthy ways to reduce stress.   Depression: - Provides group verbal and written instruction on the correlation between heart/lung disease and depressed mood, treatment options, and the stigmas associated with seeking treatment.   Exercise & Equipment Safety: - Individual verbal instruction and demonstration of equipment use and safety with use of the equipment.   Infection Prevention: - Provides verbal and written material to individual with discussion of infection control including proper hand washing and proper equipment cleaning during exercise session.   Falls Prevention: - Provides verbal and written material to individual with discussion of falls prevention and safety.   Diabetes: - Individual verbal and written instruction to review signs/symptoms of diabetes, desired ranges of glucose level fasting, after meals and with exercise. Advice that pre and post exercise glucose checks will be done for 3 sessions at entry of program.   Chronic Lung Diseases: -  Group verbal and written instruction to review new  updates, new respiratory medications, new advancements in procedures and treatments. Provide informative websites and "800" numbers of self-education.   Lung Procedures: - Group verbal and written instruction to describe testing methods done to diagnose lung disease. Review the outcome of test results. Describe the treatment choices: Pulmonary Function Tests, ABGs and oximetry.   Energy Conservation: - Provide group verbal and written instruction for methods to conserve energy, plan and organize activities. Instruct on pacing techniques, use of adaptive equipment and posture/positioning to relieve shortness of breath.   Triggers: - Group verbal and written instruction to review types of environmental controls: home humidity, furnaces, filters, dust mite/pet prevention, HEPA vacuums. To discuss weather changes, air quality and the benefits of nasal washing.   Exacerbations: - Group verbal and written instruction to provide: warning signs, infection symptoms, calling MD promptly, preventive modes, and value of vaccinations. Review: effective airway clearance, coughing and/or vibration techniques. Create an Sport and exercise psychologist.   Oxygen: - Individual and group verbal and written instruction on oxygen therapy. Includes supplement oxygen, available portable oxygen systems, continuous and intermittent flow rates, oxygen safety, concentrators, and Medicare reimbursement for oxygen.   Respiratory Medications: - Group verbal and written instruction to review medications for lung disease. Drug class, frequency, complications, importance of spacers, rinsing mouth after steroid MDI's, and proper cleaning methods for nebulizers.   AED/CPR: - Group verbal and written instruction with the use of models to demonstrate the basic use of the AED with the basic ABC's of resuscitation.   Breathing Retraining: - Provides individuals verbal and written instruction on purpose, frequency, and proper technique of  diaphragmatic breathing and pursed-lipped breathing. Applies individual practice skills.   Anatomy and Physiology of the Lungs: - Group verbal and written instruction with the use of models to provide basic lung anatomy and physiology related to function, structure and complications of lung disease.   Heart Failure: - Group verbal and written instruction on the basics of heart failure: signs/symptoms, treatments, explanation of ejection fraction, enlarged heart and cardiomyopathy.   Sleep Apnea: - Individual verbal and written instruction to review Obstructive Sleep Apnea. Review of risk factors, methods for diagnosing and types of masks and machines for OSA.   Anxiety: - Provides group, verbal and written instruction on the correlation between heart/lung disease and anxiety, treatment options, and management of anxiety.   Relaxation: - Provides group, verbal and written instruction about the benefits of relaxation for patients with heart/lung disease. Also provides patients with examples of relaxation techniques.   Knowledge Questionnaire Score:     Knowledge Questionnaire Score - 05/24/15 1330    Knowledge Questionnaire Score   Pre Score -7      Personal Goals and Risk Factors at Admission:     Personal Goals and Risk Factors at Admission - 05/24/15 1330    Personal Goals and Risk Factors on Admission    Weight Management Yes   Intervention Learn and follow the exercise and diet guidelines while in the program. Utilize the nutrition and education classes to help gain knowledge of the diet and exercise expectations in the program  Jackie Hahne prefers not to meet with the dietitian; she cooks a lot of vegetables, no salt, and likes apples and peaches.   Admit Weight 258 lb 11.2 oz (117.346 kg)   Goal Weight 175 lb (79.379 kg)   Increase Aerobic Exercise and Physical Activity Yes   Intervention While in program, learn and follow the exercise prescription  taught. Start at a low  level workload and increase workload after able to maintain previous level for 30 minutes. Increase time before increasing intensity.  Jackie Diego has a home treadmill and stationary bike.   Understand more about Heart/Pulmonary Disease. Yes   Intervention While in program utilize professionals for any questions, and attend the education sessions. Great websites to use are www.americanheart.org or www.lung.org for reliable information.   Improve shortness of breath with ADL's Yes   Intervention While in program, learn and follow the exercise prescription taught. Start at a low level workload and increase workload ad advised by the exercise physiologist. Increase time before increasing intensity.   Develop more efficient breathing techniques such as purse lipped breathing and diaphragmatic breathing; and practicing self-pacing with activity Yes   Intervention While in program, learn and utilize the specific breathing techniques taught to you. Continue to practice and use the techniques as needed.   Increase knowledge of respiratory medications and ability to use respiratory devices properly.  Yes   Intervention While in program, learn to administer MDI, nebulizer, and spacer properly.;Learn to take respiratory medicine as ordered.;While in program, learn to Clean MDI, nebulizers, and spacers properly.  Jackie Coghlan takes ProAir and Virgel Bouquet; a spacer was given to her for her ProAir.   Diabetes Yes   Goal Blood glucose control identified by blood glucose values, HgbA1C. Participant verbalizes understanding of the signs/symptoms of hyper/hypo glycemia, proper foot care and importance of medication and nutrition plan for blood glucose control.   Intervention Provide nutrition & aerobic exercise along with prescribed medications to achieve blood glucose in normal ranges: Fasting 65-99 mg/dL   Hypertension Yes   Goal Participant will see blood pressure controlled within the values of 140/52mm/Hg or within value  directed by their physician.   Intervention Provide nutrition & aerobic exercise along with prescribed medications to achieve BP 140/90 or less.   Lipids Yes   Goal Cholesterol controlled with medications as prescribed, with individualized exercise RX and with personalized nutrition plan. Value goals: LDL < 70mg , HDL > 40mg . Participant states understanding of desired cholesterol values and following prescriptions.   Intervention Provide nutrition & aerobic exercise along with prescribed medications to achieve LDL 70mg , HDL >40mg .      Personal Goals and Risk Factors Review:    Personal Goals Discharge:    Comments: Jackie Gleed plans to start [facility] 05/30/15 and will attend 3 days/week.

## 2015-05-30 ENCOUNTER — Telehealth: Payer: Self-pay

## 2015-05-30 ENCOUNTER — Ambulatory Visit: Payer: Medicare Other | Admitting: *Deleted

## 2015-05-30 NOTE — Telephone Encounter (Signed)
Pt. Called to report her right hip/leg has been bothering her, and she is going to see the doctor this week and will call us back to let us know what her plans are.

## 2015-06-01 ENCOUNTER — Ambulatory Visit: Payer: Medicare Other

## 2015-06-03 ENCOUNTER — Ambulatory Visit: Payer: Medicare Other

## 2015-06-06 ENCOUNTER — Ambulatory Visit: Payer: Medicare Other

## 2015-06-06 DIAGNOSIS — G4730 Sleep apnea, unspecified: Secondary | ICD-10-CM

## 2015-06-06 DIAGNOSIS — J449 Chronic obstructive pulmonary disease, unspecified: Secondary | ICD-10-CM

## 2015-06-06 NOTE — Progress Notes (Signed)
Pulmonary Individual Treatment Plan  Patient Details  Name: Jackie Tucker MRN: [id number] Date of Birth: 30-Apr-1934 Referring Provider:  Dr. Freda Munro Initial Encounter Date:  05/24/2015  Visit Diagnosis: Sleep apnea  COPD, mild  Patient's Home Medications on Admission:  Current outpatient prescriptions:  .  ALLOPURINOL PO, Take by mouth daily., Disp: , Rfl:  .  aspirin 81 MG tablet, Take 81 mg by mouth 2 (two) times daily., Disp: , Rfl:  .  atenolol (TENORMIN) 50 MG tablet, Take 50 mg by mouth daily., Disp: , Rfl:  .  ferrous sulfate 325 (65 FE) MG tablet, Take 325 mg by mouth daily with breakfast., Disp: , Rfl:  .  furosemide (LASIX) 40 MG tablet, Take 40 mg by mouth daily., Disp: , Rfl:  .  gabapentin (NEURONTIN) 300 MG capsule, Take 300 mg by mouth 3 (three) times daily., Disp: , Rfl:  .  losartan (COZAAR) 50 MG tablet, Take 50 mg by mouth daily., Disp: , Rfl:  .  omeprazole (PRILOSEC) 20 MG capsule, Take 20 mg by mouth 2 (two) times daily before a meal., Disp: , Rfl:  .  oxyCODONE-acetaminophen (PERCOCET/ROXICET) 5-325 MG per tablet, Take by mouth 2 (two) times daily., Disp: , Rfl:  .  PARoxetine (PAXIL) 20 MG tablet, Take 20 mg by mouth daily., Disp: , Rfl:   Past Medical History: Past Medical History  Diagnosis Date  . Diabetes   . Sleep apnea     Tobacco Use: History  Smoking status  . Never Smoker   Smokeless tobacco  . Not on file    Labs: Recent Review Flowsheet Data    There is no flowsheet data to display.       ADL UCSD:     ADL UCSD      05/24/15 1330       ADL UCSD   ADL Phase Entry     SOB Score total 40     Rest 0     Walk 0     Stairs 3     Bath 1     Dress 1     Shop 2         Pulmonary Function Assessment:     Pulmonary Function Assessment - 05/24/15 1446    Pulmonary Function Tests   RV% 91 %   DLCO% 58 %   Initial Spirometry Results   FVC% 67 %   FEV1% 79 %   FEV1/FVC Ratio 90   Post Bronchodilator Spirometry  Results   FVC% 81 %   FEV1% 93 %   FEV1/FVC Ratio 87   Breath   Shortness of Breath Yes;Fear of Shortness of Breath;Limiting activity      Exercise Target Goals:    Exercise Program Goal: Individual exercise prescription set with THRR, safety & activity barriers. Participant demonstrates ability to understand and report RPE using BORG scale, to self-measure pulse accurately, and to acknowledge the importance of the exercise prescription.  Exercise Prescription Goal: Starting with aerobic activity 30 plus minutes a day, 3 days per week for initial exercise prescription. Provide home exercise prescription and guidelines that participant acknowledges understanding prior to discharge.  Activity Barriers & Risk Stratification:     Activity Barriers & Risk Stratification - 05/24/15 1330    Activity Barriers & Risk Stratification   Activity Barriers Shortness of Breath;Back Problems   Risk Stratification Low      6 Minute Walk:     6 Minute Walk  05/24/15 1526       6 Minute Walk   Phase Initial     Distance 400 feet     Walk Time 3 minutes     Resting HR 75 bpm     Resting BP 152/78 mmHg     Max Ex. HR 89 bpm     Max Ex. BP 222/72 mmHg     RPE 18     Perceived Dyspnea  6     Symptoms No        Initial Exercise Prescription:     Initial Exercise Prescription - 05/24/15 1500    Date of Initial Exercise Prescription   Date 05/24/15   Treadmill   MPH 1   Grade 0   Minutes 10   Recumbant Bike   Level 1   RPM 30   Watts 20   Minutes 10   NuStep   Level 2   Watts 40   Minutes 10   Arm Ergometer   Level 1   Watts 10   Minutes 10   REL-XR   Level 1   Watts 40   Minutes 10   Prescription Details   Frequency (times per week) 3   Duration Progress to 30 minutes of continuous aerobic without signs/symptoms of physical distress   Intensity   THRR REST +  30   Ratings of Perceived Exertion 11-15   Perceived Dyspnea 2-4   Progression Continue  progressive overload as per policy without signs/symptoms or physical distress.   Resistance Training   Training Prescription Yes   Weight 1   Reps 10-12      Exercise Prescription Changes:   Discharge Exercise Prescription (Final Exercise Prescription Changes):    Nutrition:  Target Goals: Understanding of nutrition guidelines, daily intake of sodium 1500mg , cholesterol 200mg , calories 30% from fat and 7% or less from saturated fats, daily to have 5 or more servings of fruits and vegetables.  Biometrics:     Pre Biometrics - 05/24/15 1528    Pre Biometrics   Height 5\' 5"  (1.651 m)   Weight 258 lb 11.2 oz (117.346 kg)   Waist Circumference 46 inches   Hip Circumference 51.5 inches   Waist to Hip Ratio 0.89 %   BMI (Calculated) 43.1       Nutrition Therapy Plan and Nutrition Goals:     Nutrition Therapy & Goals - 05/24/15 1330    Nutrition Therapy   Diet Jackie Tucker prefers not to meet with the dietitian; she cooks healthy with vegetables, no salt; her goal is to weigh 175lbs      Nutrition Discharge: Rate Your Plate Scores:   Psychosocial: Target Goals: Acknowledge presence or absence of depression, maximize coping skills, provide positive support system. Participant is able to verbalize types and ability to use techniques and skills needed for reducing stress and depression.  Initial Review & Psychosocial Screening:     Initial Psych Review & Screening - 05/24/15 1330    Initial Review   Current issues with History of Depression   Family Dynamics   Good Support System? Yes   Comments Jackie Tucker is very pleasant lady. She has been with a partner for 29 years. Her first husband died in the 15's. She has 2 children and a third child who was killed by her exboyfriend years ago. This has been very difficult for her .   Barriers   Psychosocial barriers to participate in program The patient should benefit from training in  stress management and relaxation.   Screening  Interventions   Interventions Encouraged to exercise;Program counselor consult      Quality of Life Scores:     Quality of Life - 05/24/15 1330    Quality of Life Scores   Health/Function Pre 9.6 %   Socioeconomic Pre 5.14 %   Psych/Spiritual Pre 12 %   Family Pre 4.5 %   GLOBAL Pre 8.55 %      PHQ-9:     Recent Review Flowsheet Data    Depression screen [facility] 2/9 05/24/2015   Decreased Interest 0   Down, Depressed, Hopeless 0   PHQ - 2 Score 0      Psychosocial Evaluation and Intervention:   Psychosocial Re-Evaluation:  Education: Education Goals: Education classes will be provided on a weekly basis, covering required topics. Participant will state understanding/return demonstration of topics presented.  Learning Barriers/Preferences:     Learning Barriers/Preferences - 05/24/15 1330    Learning Barriers/Preferences   Learning Barriers None   Learning Preferences Group Instruction;Individual Instruction;Pictoral;Skilled Demonstration;Verbal Instruction;Video;Written Material      Education Topics: Initial Evaluation Education: - Verbal, written and demonstration of respiratory meds, RPE/PD scales, oximetry and breathing techniques. Instruction on use of nebulizers and MDIs: cleaning and proper use, rinsing mouth with steroid doses and importance of monitoring MDI activations.          Pulmonary Rehab from 05/24/2015 in [facility]   Date  05/24/15   Educator  LB   Instruction Review Code  2- meets goals/outcomes      General Nutrition Guidelines/Fats and Fiber: -Group instruction provided by verbal, written material, models and posters to present the general guidelines for heart healthy nutrition. Gives an explanation and review of dietary fats and fiber.   Controlling Sodium/Reading Food Labels: -Group verbal and written material supporting the discussion of sodium use in heart healthy nutrition. Review and explanation  with models, verbal and written materials for utilization of the food label.   Exercise Physiology & Risk Factors: - Group verbal and written instruction with models to review the exercise physiology of the cardiovascular system and associated critical values. Details cardiovascular disease risk factors and the goals associated with each risk factor.   Aerobic Exercise & Resistance Training: - Gives group verbal and written discussion on the health impact of inactivity. On the components of aerobic and resistive training programs and the benefits of this training and how to safely progress through these programs.   Flexibility, Balance, General Exercise Guidelines: - Provides group verbal and written instruction on the benefits of flexibility and balance training programs. Provides general exercise guidelines with specific guidelines to those with heart or lung disease. Demonstration and skill practice provided.   Stress Management: - Provides group verbal and written instruction about the health risks of elevated stress, cause of high stress, and healthy ways to reduce stress.   Depression: - Provides group verbal and written instruction on the correlation between heart/lung disease and depressed mood, treatment options, and the stigmas associated with seeking treatment.   Exercise & Equipment Safety: - Individual verbal instruction and demonstration of equipment use and safety with use of the equipment.   Infection Prevention: - Provides verbal and written material to individual with discussion of infection control including proper hand washing and proper equipment cleaning during exercise session.   Falls Prevention: - Provides verbal and written material to individual with discussion of falls prevention and safety.      Pulmonary  Rehab from 05/24/2015 in [facility]   Date  05/24/15   Educator  LB   Instruction Review Code  2- meets  goals/outcomes      Diabetes: - Individual verbal and written instruction to review signs/symptoms of diabetes, desired ranges of glucose level fasting, after meals and with exercise. Advice that pre and post exercise glucose checks will be done for 3 sessions at entry of program.   Chronic Lung Diseases: - Group verbal and written instruction to review new updates, new respiratory medications, new advancements in procedures and treatments. Provide informative websites and "800" numbers of self-education.   Lung Procedures: - Group verbal and written instruction to describe testing methods done to diagnose lung disease. Review the outcome of test results. Describe the treatment choices: Pulmonary Function Tests, ABGs and oximetry.   Energy Conservation: - Provide group verbal and written instruction for methods to conserve energy, plan and organize activities. Instruct on pacing techniques, use of adaptive equipment and posture/positioning to relieve shortness of breath.   Triggers: - Group verbal and written instruction to review types of environmental controls: home humidity, furnaces, filters, dust mite/pet prevention, HEPA vacuums. To discuss weather changes, air quality and the benefits of nasal washing.   Exacerbations: - Group verbal and written instruction to provide: warning signs, infection symptoms, calling MD promptly, preventive modes, and value of vaccinations. Review: effective airway clearance, coughing and/or vibration techniques. Create an Sport and exercise psychologist.   Oxygen: - Individual and group verbal and written instruction on oxygen therapy. Includes supplement oxygen, available portable oxygen systems, continuous and intermittent flow rates, oxygen safety, concentrators, and Medicare reimbursement for oxygen.      Pulmonary Rehab from 05/24/2015 in [facility]   Date  05/24/15   Educator  LB   Instruction Review Code  2- meets  goals/outcomes [Oxygen at night with CPAP]      Respiratory Medications: - Group verbal and written instruction to review medications for lung disease. Drug class, frequency, complications, importance of spacers, rinsing mouth after steroid MDI's, and proper cleaning methods for nebulizers.      Pulmonary Rehab from 05/24/2015 in [facility]   Date  05/24/15   Educator  LB   Instruction Review Code  2- meets goals/outcomes      AED/CPR: - Group verbal and written instruction with the use of models to demonstrate the basic use of the AED with the basic ABC's of resuscitation.   Breathing Retraining: - Provides individuals verbal and written instruction on purpose, frequency, and proper technique of diaphragmatic breathing and pursed-lipped breathing. Applies individual practice skills.      Pulmonary Rehab from 05/24/2015 in [facility]   Date  05/24/15   Educator  LB   Instruction Review Code  2- meets goals/outcomes      Anatomy and Physiology of the Lungs: - Group verbal and written instruction with the use of models to provide basic lung anatomy and physiology related to function, structure and complications of lung disease.   Heart Failure: - Group verbal and written instruction on the basics of heart failure: signs/symptoms, treatments, explanation of ejection fraction, enlarged heart and cardiomyopathy.   Sleep Apnea: - Individual verbal and written instruction to review Obstructive Sleep Apnea. Review of risk factors, methods for diagnosing and types of masks and machines for OSA.      Pulmonary Rehab from 05/24/2015 in [facility]  Date  05/24/15   Educator  LB   Instruction Review Code  2- meets goals/outcomes [Full face mask with humidity]      Anxiety: - Provides group, verbal and written instruction on the correlation between heart/lung disease and anxiety,  treatment options, and management of anxiety.   Relaxation: - Provides group, verbal and written instruction about the benefits of relaxation for patients with heart/lung disease. Also provides patients with examples of relaxation techniques.   Knowledge Questionnaire Score:     Knowledge Questionnaire Score - 05/24/15 1330    Knowledge Questionnaire Score   Pre Score -7      Personal Goals and Risk Factors at Admission:     Personal Goals and Risk Factors at Admission - 05/24/15 1330    Personal Goals and Risk Factors on Admission    Weight Management Yes   Intervention Learn and follow the exercise and diet guidelines while in the program. Utilize the nutrition and education classes to help gain knowledge of the diet and exercise expectations in the program  Jackie Tucker prefers not to meet with the dietitian; she cooks a lot of vegetables, no salt, and likes apples and peaches.   Admit Weight 258 lb 11.2 oz (117.346 kg)   Goal Weight 175 lb (79.379 kg)   Increase Aerobic Exercise and Physical Activity Yes   Intervention While in program, learn and follow the exercise prescription taught. Start at a low level workload and increase workload after able to maintain previous level for 30 minutes. Increase time before increasing intensity.  Jackie Tucker has a home treadmill and stationary bike.   Understand more about Heart/Pulmonary Disease. Yes   Intervention While in program utilize professionals for any questions, and attend the education sessions. Great websites to use are www.americanheart.org or www.lung.org for reliable information.   Improve shortness of breath with ADL's Yes   Intervention While in program, learn and follow the exercise prescription taught. Start at a low level workload and increase workload ad advised by the exercise physiologist. Increase time before increasing intensity.   Develop more efficient breathing techniques such as purse lipped breathing and diaphragmatic  breathing; and practicing self-pacing with activity Yes   Intervention While in program, learn and utilize the specific breathing techniques taught to you. Continue to practice and use the techniques as needed.   Increase knowledge of respiratory medications and ability to use respiratory devices properly.  Yes   Intervention While in program, learn to administer MDI, nebulizer, and spacer properly.;Learn to take respiratory medicine as ordered.;While in program, learn to Clean MDI, nebulizers, and spacers properly.  Jackie Tucker takes ProAir and Virgel BouquetBreo; a spacer was given to her for her ProAir.   Diabetes Yes   Goal Blood glucose control identified by blood glucose values, HgbA1C. Participant verbalizes understanding of the signs/symptoms of hyper/hypo glycemia, proper foot care and importance of medication and nutrition plan for blood glucose control.   Intervention Provide nutrition & aerobic exercise along with prescribed medications to achieve blood glucose in normal ranges: Fasting 65-99 mg/dL   Hypertension Yes   Goal Participant will see blood pressure controlled within the values of 140/1090mm/Hg or within value directed by their physician.   Intervention Provide nutrition & aerobic exercise along with prescribed medications to achieve BP 140/90 or less.   Lipids Yes   Goal Cholesterol controlled with medications as prescribed, with individualized exercise RX and with personalized nutrition plan. Value goals: LDL < 70mg , HDL > 40mg . Participant states understanding of  desired cholesterol values and following prescriptions.   Intervention Provide nutrition & aerobic exercise along with prescribed medications to achieve LDL 70mg , HDL >40mg .      Personal Goals and Risk Factors Review:    Personal Goals Discharge:    Comments: Has attended orientation. Waiting to start program. Continue with ITP

## 2015-06-08 ENCOUNTER — Telehealth: Payer: Self-pay | Admitting: Internal Medicine

## 2015-06-08 ENCOUNTER — Ambulatory Visit: Payer: Medicare Other

## 2015-06-08 NOTE — Telephone Encounter (Signed)
Ms Jackie Tucker has an appointment to see her physician tomorrow for her right hip/leg pain and [facility]

## 2015-06-10 ENCOUNTER — Ambulatory Visit: Payer: Medicare Other

## 2015-06-13 ENCOUNTER — Ambulatory Visit: Payer: Medicare Other

## 2015-06-15 ENCOUNTER — Ambulatory Visit: Payer: Medicare Other

## 2015-06-17 ENCOUNTER — Ambulatory Visit: Payer: Medicare Other

## 2015-06-20 ENCOUNTER — Ambulatory Visit: Payer: Medicare Other

## 2015-06-21 ENCOUNTER — Telehealth: Payer: Self-pay | Admitting: Internal Medicine

## 2015-06-21 ENCOUNTER — Encounter: Payer: Self-pay | Admitting: Internal Medicine

## 2015-06-21 NOTE — Progress Notes (Signed)
Pulmonary Individual Treatment Plan  Patient Details  Name: Jackie Tucker MRN: [id number] Date of Birth: 03/31/1934 Referring Provider:  Dr Freda Munro  Initial Encounter Date: 05/24/2015  Visit Diagnosis: Sleep Apnea  Patient's Home Medications on Admission:  Current outpatient prescriptions:    ALLOPURINOL PO, Take by mouth daily., Disp: , Rfl:    aspirin 81 MG tablet, Take 81 mg by mouth 2 (two) times daily., Disp: , Rfl:    atenolol (TENORMIN) 50 MG tablet, Take 50 mg by mouth daily., Disp: , Rfl:    ferrous sulfate 325 (65 FE) MG tablet, Take 325 mg by mouth daily with breakfast., Disp: , Rfl:    furosemide (LASIX) 40 MG tablet, Take 40 mg by mouth daily., Disp: , Rfl:    gabapentin (NEURONTIN) 300 MG capsule, Take 300 mg by mouth 3 (three) times daily., Disp: , Rfl:    losartan (COZAAR) 50 MG tablet, Take 50 mg by mouth daily., Disp: , Rfl:    omeprazole (PRILOSEC) 20 MG capsule, Take 20 mg by mouth 2 (two) times daily before a meal., Disp: , Rfl:    oxyCODONE-acetaminophen (PERCOCET/ROXICET) 5-325 MG per tablet, Take by mouth 2 (two) times daily., Disp: , Rfl:    PARoxetine (PAXIL) 20 MG tablet, Take 20 mg by mouth daily., Disp: , Rfl:   Past Medical History: Past Medical History  Diagnosis Date   Diabetes    Sleep apnea     Tobacco Use: History  Smoking status   Never Smoker   Smokeless tobacco   Not on file    Labs: Recent Review Flowsheet Data    There is no flowsheet data to display.       ADL [facility]:     ADL [facility]      05/24/15 1330       ADL [facility]   ADL Phase Entry     SOB Score total 40     Rest 0     Walk 0     Stairs 3     Bath 1     Dress 1     Shop 2         Pulmonary Function Assessment:     Pulmonary Function Assessment - 05/24/15 1446    Pulmonary Function Tests   RV% 91 %   DLCO% 58 %   Initial Spirometry Results   FVC% 67 %   FEV1% 79 %   FEV1/FVC Ratio 90   Post Bronchodilator Spirometry Results   FVC% 81  %   FEV1% 93 %   FEV1/FVC Ratio 87   Breath   Shortness of Breath Yes;Fear of Shortness of Breath;Limiting activity      Exercise Target Goals:    Exercise Program Goal: Individual exercise prescription set with THRR, safety & activity barriers. Participant demonstrates ability to understand and report RPE using BORG scale, to self-measure pulse accurately, and to acknowledge the importance of the exercise prescription.  Exercise Prescription Goal: Starting with aerobic activity 30 plus minutes a day, 3 days per week for initial exercise prescription. Provide home exercise prescription and guidelines that participant acknowledges understanding prior to discharge.  Activity Barriers & Risk Stratification:     Activity Barriers & Risk Stratification - 05/24/15 1330    Activity Barriers & Risk Stratification   Activity Barriers Shortness of Breath;Back Problems   Risk Stratification Low      6 Minute Walk:     6 Minute Walk      05/24/15 1526  6 Minute Walk   Phase Initial     Distance 400 feet     Walk Time 3 minutes     Resting HR 75 bpm     Resting BP 152/78 mmHg     Max Ex. HR 89 bpm     Max Ex. BP 222/72 mmHg     RPE 18     Perceived Dyspnea  6     Symptoms No        Initial Exercise Prescription:     Initial Exercise Prescription - 05/24/15 1500    Date of Initial Exercise Prescription   Date 05/24/15   Treadmill   MPH 1   Grade 0   Minutes 10   Recumbant Bike   Level 1   RPM 30   Watts 20   Minutes 10   NuStep   Level 2   Watts 40   Minutes 10   Arm Ergometer   Level 1   Watts 10   Minutes 10   REL-XR   Level 1   Watts 40   Minutes 10   Prescription Details   Frequency (times per week) 3   Duration Progress to 30 minutes of continuous aerobic without signs/symptoms of physical distress   Intensity   THRR REST +  30   Ratings of Perceived Exertion 11-15   Perceived Dyspnea 2-4   Progression Continue progressive overload as per  policy without signs/symptoms or physical distress.   Resistance Training   Training Prescription Yes   Weight 1   Reps 10-12      Exercise Prescription Changes:   Discharge Exercise Prescription (Final Exercise Prescription Changes):    Nutrition:  Target Goals: Understanding of nutrition guidelines, daily intake of sodium 1500mg , cholesterol 200mg , calories 30% from fat and 7% or less from saturated fats, daily to have 5 or more servings of fruits and vegetables.  Biometrics:     Pre Biometrics - 05/24/15 1528    Pre Biometrics   Height 5\' 5"  (1.651 m)   Weight 258 lb 11.2 oz (117.346 kg)   Waist Circumference 46 inches   Hip Circumference 51.5 inches   Waist to Hip Ratio 0.89 %   BMI (Calculated) 43.1       Nutrition Therapy Plan and Nutrition Goals:     Nutrition Therapy & Goals - 05/24/15 1330    Nutrition Therapy   Diet Ms Woods prefers not to meet with the dietitian; she cooks healthy with vegetables, no salt; her goal is to weigh 175lbs      Nutrition Discharge: Rate Your Plate Scores:   Psychosocial: Target Goals: Acknowledge presence or absence of depression, maximize coping skills, provide positive support system. Participant is able to verbalize types and ability to use techniques and skills needed for reducing stress and depression.  Initial Review & Psychosocial Screening:     Initial Psych Review & Screening - 05/24/15 1330    Initial Review   Current issues with History of Depression   Family Dynamics   Good Support System? Yes   Comments Ms Crist is very pleasant lady. She has been with a partner for 29 years. Her first husband died in the 41's. She has 2 children and a third child who was killed by her exboyfriend years ago. This has been very difficult for her .   Barriers   Psychosocial barriers to participate in program The patient should benefit from training in stress management and relaxation.   Screening Interventions  Interventions Encouraged to exercise;Program counselor consult      Quality of Life Scores:     Quality of Life - 05/24/15 1330    Quality of Life Scores   Health/Function Pre 9.6 %   Socioeconomic Pre 5.14 %   Psych/Spiritual Pre 12 %   Family Pre 4.5 %   GLOBAL Pre 8.55 %      PHQ-9:     Recent Review Flowsheet Data    Depression screen [facility] 2/9 05/24/2015   Decreased Interest 0   Down, Depressed, Hopeless 0   PHQ - 2 Score 0      Psychosocial Evaluation and Intervention:   Psychosocial Re-Evaluation:  Education: Education Goals: Education classes will be provided on a weekly basis, covering required topics. Participant will state understanding/return demonstration of topics presented.  Learning Barriers/Preferences:     Learning Barriers/Preferences - 05/24/15 1330    Learning Barriers/Preferences   Learning Barriers None   Learning Preferences Group Instruction;Individual Instruction;Pictoral;Skilled Demonstration;Verbal Instruction;Video;Written Material      Education Topics: Initial Evaluation Education: - Verbal, written and demonstration of respiratory meds, RPE/PD scales, oximetry and breathing techniques. Instruction on use of nebulizers and MDIs: cleaning and proper use, rinsing mouth with steroid doses and importance of monitoring MDI activations.          Pulmonary Rehab from 05/24/2015 in [facility]   Date  05/24/15   Educator  LB   Instruction Review Code  2- meets goals/outcomes      General Nutrition Guidelines/Fats and Fiber: -Group instruction provided by verbal, written material, models and posters to present the general guidelines for heart healthy nutrition. Gives an explanation and review of dietary fats and fiber.   Controlling Sodium/Reading Food Labels: -Group verbal and written material supporting the discussion of sodium use in heart healthy nutrition. Review and explanation with models,  verbal and written materials for utilization of the food label.   Exercise Physiology & Risk Factors: - Group verbal and written instruction with models to review the exercise physiology of the cardiovascular system and associated critical values. Details cardiovascular disease risk factors and the goals associated with each risk factor.   Aerobic Exercise & Resistance Training: - Gives group verbal and written discussion on the health impact of inactivity. On the components of aerobic and resistive training programs and the benefits of this training and how to safely progress through these programs.   Flexibility, Balance, General Exercise Guidelines: - Provides group verbal and written instruction on the benefits of flexibility and balance training programs. Provides general exercise guidelines with specific guidelines to those with heart or lung disease. Demonstration and skill practice provided.   Stress Management: - Provides group verbal and written instruction about the health risks of elevated stress, cause of high stress, and healthy ways to reduce stress.   Depression: - Provides group verbal and written instruction on the correlation between heart/lung disease and depressed mood, treatment options, and the stigmas associated with seeking treatment.   Exercise & Equipment Safety: - Individual verbal instruction and demonstration of equipment use and safety with use of the equipment.   Infection Prevention: - Provides verbal and written material to individual with discussion of infection control including proper hand washing and proper equipment cleaning during exercise session.   Falls Prevention: - Provides verbal and written material to individual with discussion of falls prevention and safety.      Pulmonary Rehab from 05/24/2015 in [facility]  Date  05/24/15   Educator  LB   Instruction Review Code  2- meets goals/outcomes       Diabetes: - Individual verbal and written instruction to review signs/symptoms of diabetes, desired ranges of glucose level fasting, after meals and with exercise. Advice that pre and post exercise glucose checks will be done for 3 sessions at entry of program.   Chronic Lung Diseases: - Group verbal and written instruction to review new updates, new respiratory medications, new advancements in procedures and treatments. Provide informative websites and "800" numbers of self-education.   Lung Procedures: - Group verbal and written instruction to describe testing methods done to diagnose lung disease. Review the outcome of test results. Describe the treatment choices: Pulmonary Function Tests, ABGs and oximetry.   Energy Conservation: - Provide group verbal and written instruction for methods to conserve energy, plan and organize activities. Instruct on pacing techniques, use of adaptive equipment and posture/positioning to relieve shortness of breath.   Triggers: - Group verbal and written instruction to review types of environmental controls: home humidity, furnaces, filters, dust mite/pet prevention, HEPA vacuums. To discuss weather changes, air quality and the benefits of nasal washing.   Exacerbations: - Group verbal and written instruction to provide: warning signs, infection symptoms, calling MD promptly, preventive modes, and value of vaccinations. Review: effective airway clearance, coughing and/or vibration techniques. Create an Sport and exercise psychologist.   Oxygen: - Individual and group verbal and written instruction on oxygen therapy. Includes supplement oxygen, available portable oxygen systems, continuous and intermittent flow rates, oxygen safety, concentrators, and Medicare reimbursement for oxygen.      Pulmonary Rehab from 05/24/2015 in [facility]   Date  05/24/15   Educator  LB   Instruction Review Code  2- meets goals/outcomes [Oxygen at  night with CPAP]      Respiratory Medications: - Group verbal and written instruction to review medications for lung disease. Drug class, frequency, complications, importance of spacers, rinsing mouth after steroid MDI's, and proper cleaning methods for nebulizers.      Pulmonary Rehab from 05/24/2015 in [facility]   Date  05/24/15   Educator  LB   Instruction Review Code  2- meets goals/outcomes      AED/CPR: - Group verbal and written instruction with the use of models to demonstrate the basic use of the AED with the basic ABC's of resuscitation.   Breathing Retraining: - Provides individuals verbal and written instruction on purpose, frequency, and proper technique of diaphragmatic breathing and pursed-lipped breathing. Applies individual practice skills.      Pulmonary Rehab from 05/24/2015 in [facility]   Date  05/24/15   Educator  LB   Instruction Review Code  2- meets goals/outcomes      Anatomy and Physiology of the Lungs: - Group verbal and written instruction with the use of models to provide basic lung anatomy and physiology related to function, structure and complications of lung disease.   Heart Failure: - Group verbal and written instruction on the basics of heart failure: signs/symptoms, treatments, explanation of ejection fraction, enlarged heart and cardiomyopathy.   Sleep Apnea: - Individual verbal and written instruction to review Obstructive Sleep Apnea. Review of risk factors, methods for diagnosing and types of masks and machines for OSA.      Pulmonary Rehab from 05/24/2015 in [facility]   Date  05/24/15   Educator  LB  Instruction Review Code  2- meets goals/outcomes [Full face mask with humidity]      Anxiety: - Provides group, verbal and written instruction on the correlation between heart/lung disease and anxiety, treatment options, and  management of anxiety.   Relaxation: - Provides group, verbal and written instruction about the benefits of relaxation for patients with heart/lung disease. Also provides patients with examples of relaxation techniques.   Knowledge Questionnaire Score:     Knowledge Questionnaire Score - 05/24/15 1330    Knowledge Questionnaire Score   Pre Score -7      Personal Goals and Risk Factors at Admission:     Personal Goals and Risk Factors at Admission - 05/24/15 1330    Personal Goals and Risk Factors on Admission    Weight Management Yes   Intervention Learn and follow the exercise and diet guidelines while in the program. Utilize the nutrition and education classes to help gain knowledge of the diet and exercise expectations in the program  Ms Quist prefers not to meet with the dietitian; she cooks a lot of vegetables, no salt, and likes apples and peaches.   Admit Weight 258 lb 11.2 oz (117.346 kg)   Goal Weight 175 lb (79.379 kg)   Increase Aerobic Exercise and Physical Activity Yes   Intervention While in program, learn and follow the exercise prescription taught. Start at a low level workload and increase workload after able to maintain previous level for 30 minutes. Increase time before increasing intensity.  Ms Raabe has a home treadmill and stationary bike.   Understand more about Heart/Pulmonary Disease. Yes   Intervention While in program utilize professionals for any questions, and attend the education sessions. Great websites to use are www.americanheart.org or www.lung.org for reliable information.   Improve shortness of breath with ADL's Yes   Intervention While in program, learn and follow the exercise prescription taught. Start at a low level workload and increase workload ad advised by the exercise physiologist. Increase time before increasing intensity.   Develop more efficient breathing techniques such as purse lipped breathing and diaphragmatic breathing; and  practicing self-pacing with activity Yes   Intervention While in program, learn and utilize the specific breathing techniques taught to you. Continue to practice and use the techniques as needed.   Increase knowledge of respiratory medications and ability to use respiratory devices properly.  Yes   Intervention While in program, learn to administer MDI, nebulizer, and spacer properly.;Learn to take respiratory medicine as ordered.;While in program, learn to Clean MDI, nebulizers, and spacers properly.  Ms Ciampa takes ProAir and Virgel Bouquet; a spacer was given to her for her ProAir.   Diabetes Yes   Goal Blood glucose control identified by blood glucose values, HgbA1C. Participant verbalizes understanding of the signs/symptoms of hyper/hypo glycemia, proper foot care and importance of medication and nutrition plan for blood glucose control.   Intervention Provide nutrition & aerobic exercise along with prescribed medications to achieve blood glucose in normal ranges: Fasting 65-99 mg/dL   Hypertension Yes   Goal Participant will see blood pressure controlled within the values of 140/78mm/Hg or within value directed by their physician.   Intervention Provide nutrition & aerobic exercise along with prescribed medications to achieve BP 140/90 or less.   Lipids Yes   Goal Cholesterol controlled with medications as prescribed, with individualized exercise RX and with personalized nutrition plan. Value goals: LDL < 70mg , HDL > 40mg . Participant states understanding of desired cholesterol values and following prescriptions.   Intervention Provide  nutrition & aerobic exercise along with prescribed medications to achieve LDL 70mg , HDL >40mg .      Personal Goals and Risk Factors Review:    Personal Goals Discharge:   Called Ms Bousquet - she has 5 ruptured discs, has been started on prednisone, may have surgery after prednisone treatment, so will be discharged from [facility]. She is hoping to return in the  future.  Comments:

## 2015-06-21 NOTE — Telephone Encounter (Signed)
Called Jackie Tucker - she has 5 ruptured discs, has been started on prednisone, may have surgery after prednisone treatment, so will be discharged from [facility]. She is hoping to return in the future.

## 2015-06-22 ENCOUNTER — Ambulatory Visit: Payer: Medicare Other

## 2015-06-24 ENCOUNTER — Ambulatory Visit: Payer: Medicare Other

## 2015-06-27 ENCOUNTER — Ambulatory Visit: Payer: Medicare Other

## 2015-06-29 ENCOUNTER — Ambulatory Visit: Payer: Medicare Other

## 2015-07-01 ENCOUNTER — Ambulatory Visit: Payer: Medicare Other

## 2015-07-04 ENCOUNTER — Ambulatory Visit: Payer: Medicare Other

## 2015-07-06 ENCOUNTER — Ambulatory Visit: Payer: Medicare Other

## 2015-07-08 ENCOUNTER — Ambulatory Visit: Payer: Medicare Other

## 2015-07-11 ENCOUNTER — Ambulatory Visit: Payer: Medicare Other

## 2015-07-13 ENCOUNTER — Ambulatory Visit: Payer: Medicare Other

## 2015-07-15 ENCOUNTER — Ambulatory Visit: Payer: Medicare Other

## 2015-07-18 ENCOUNTER — Ambulatory Visit: Payer: Medicare Other

## 2015-07-20 ENCOUNTER — Ambulatory Visit: Payer: Medicare Other

## 2015-07-22 ENCOUNTER — Ambulatory Visit: Payer: Medicare Other

## 2015-07-27 ENCOUNTER — Ambulatory Visit: Payer: Medicare Other

## 2015-07-29 ENCOUNTER — Ambulatory Visit: Payer: Medicare Other

## 2015-08-01 ENCOUNTER — Ambulatory Visit: Payer: Medicare Other

## 2015-08-03 ENCOUNTER — Ambulatory Visit: Payer: Medicare Other

## 2015-08-05 ENCOUNTER — Ambulatory Visit: Payer: Medicare Other

## 2015-08-08 ENCOUNTER — Ambulatory Visit: Payer: Medicare Other

## 2015-08-10 ENCOUNTER — Ambulatory Visit: Payer: Medicare Other

## 2015-08-12 ENCOUNTER — Ambulatory Visit: Payer: Medicare Other

## 2015-08-15 ENCOUNTER — Ambulatory Visit: Payer: Medicare Other

## 2015-08-17 ENCOUNTER — Ambulatory Visit: Payer: Medicare Other

## 2015-08-19 ENCOUNTER — Ambulatory Visit: Payer: Medicare Other

## 2015-08-22 ENCOUNTER — Ambulatory Visit: Payer: Medicare Other

## 2015-08-24 ENCOUNTER — Ambulatory Visit: Payer: Medicare Other

## 2016-02-15 ENCOUNTER — Other Ambulatory Visit: Payer: Self-pay | Admitting: Internal Medicine

## 2016-02-15 DIAGNOSIS — Z1231 Encounter for screening mammogram for malignant neoplasm of breast: Secondary | ICD-10-CM

## 2016-02-27 ENCOUNTER — Ambulatory Visit: Payer: Medicare Other | Attending: Internal Medicine

## 2016-04-12 ENCOUNTER — Other Ambulatory Visit: Payer: Self-pay | Admitting: Internal Medicine

## 2016-04-12 ENCOUNTER — Ambulatory Visit
Admission: RE | Admit: 2016-04-12 | Discharge: 2016-04-12 | Disposition: A | Discharge status: Home / Self Care | Payer: Medicare HMO | Source: Ambulatory Visit | Attending: Internal Medicine | Admitting: Internal Medicine

## 2016-04-12 DIAGNOSIS — Z1231 Encounter for screening mammogram for malignant neoplasm of breast: Secondary | ICD-10-CM

## 2018-10-20 ENCOUNTER — Emergency Department
Admission: EM | Admit: 2018-10-20 | Discharge: 2018-10-20 | Disposition: A | Discharge status: Home / Self Care | Payer: Medicare HMO | Attending: Emergency Medicine | Admitting: Emergency Medicine

## 2018-10-20 ENCOUNTER — Encounter: Payer: Self-pay | Admitting: Medical Oncology

## 2018-10-20 DIAGNOSIS — R42 Dizziness and giddiness: Secondary | ICD-10-CM | POA: Insufficient documentation

## 2018-10-20 DIAGNOSIS — Z5321 Procedure and treatment not carried out due to patient leaving prior to being seen by health care provider: Secondary | ICD-10-CM | POA: Insufficient documentation

## 2018-10-20 DIAGNOSIS — R51 Headache: Secondary | ICD-10-CM | POA: Diagnosis present

## 2018-10-20 LAB — CBC
HCT: 38.1 % (ref 36.0–46.0)
HEMOGLOBIN: 12.1 g/dL (ref 12.0–15.0)
MCH: 28.3 pg (ref 26.0–34.0)
MCHC: 31.8 g/dL (ref 30.0–36.0)
MCV: 89 fL (ref 80.0–100.0)
NRBC: 0 % (ref 0.0–0.2)
Platelets: 246 10*3/uL (ref 150–400)
RBC: 4.28 MIL/uL (ref 3.87–5.11)
RDW: 14.8 % (ref 11.5–15.5)
WBC: 8.2 10*3/uL (ref 4.0–10.5)

## 2018-10-20 LAB — BASIC METABOLIC PANEL
ANION GAP: 8 (ref 5–15)
BUN: 25 mg/dL — AB (ref 8–23)
CO2: 26 mmol/L (ref 22–32)
Calcium: 10.6 mg/dL — ABNORMAL HIGH (ref 8.9–10.3)
Chloride: 102 mmol/L (ref 98–111)
Creatinine, Ser: 1.21 mg/dL — ABNORMAL HIGH (ref 0.44–1.00)
GFR, EST AFRICAN AMERICAN: 48 mL/min — AB (ref >60)
GFR, EST NON AFRICAN AMERICAN: 42 mL/min — AB (ref >60)
Glucose, Bld: 134 mg/dL — ABNORMAL HIGH (ref 70–99)
Potassium: 4.2 mmol/L (ref 3.5–5.1)
SODIUM: 136 mmol/L (ref 135–145)

## 2018-10-20 NOTE — ED Triage Notes (Signed)
Pt reports that she began yesterday having headache and dizziness, states that she had to make herself eat because she did not feel well.

## 2018-10-21 ENCOUNTER — Telehealth: Payer: Self-pay | Admitting: Emergency Medicine

## 2018-10-21 NOTE — Telephone Encounter (Signed)
Called patient due to [facility] to inquire about condition and follow up plans.  No answer and no voicemail  

## 2018-12-18 ENCOUNTER — Other Ambulatory Visit: Payer: Self-pay | Admitting: Internal Medicine

## 2018-12-18 DIAGNOSIS — Z1231 Encounter for screening mammogram for malignant neoplasm of breast: Secondary | ICD-10-CM

## 2020-07-07 ENCOUNTER — Other Ambulatory Visit: Payer: Self-pay | Admitting: Internal Medicine

## 2020-07-07 DIAGNOSIS — R42 Dizziness and giddiness: Secondary | ICD-10-CM

## 2020-07-07 DIAGNOSIS — R296 Repeated falls: Secondary | ICD-10-CM

## 2020-07-07 DIAGNOSIS — I959 Hypotension, unspecified: Secondary | ICD-10-CM

## 2020-07-26 ENCOUNTER — Ambulatory Visit
Admission: RE | Admit: 2020-07-26 | Discharge: 2020-07-26 | Disposition: A | Discharge status: Home / Self Care | Payer: Medicare HMO | Source: Ambulatory Visit | Attending: Internal Medicine | Admitting: Internal Medicine

## 2020-07-26 ENCOUNTER — Other Ambulatory Visit: Payer: Self-pay

## 2020-07-26 DIAGNOSIS — R42 Dizziness and giddiness: Secondary | ICD-10-CM

## 2020-07-26 DIAGNOSIS — I959 Hypotension, unspecified: Secondary | ICD-10-CM | POA: Diagnosis present

## 2020-07-26 DIAGNOSIS — R296 Repeated falls: Secondary | ICD-10-CM | POA: Diagnosis present

## 2021-07-06 ENCOUNTER — Emergency Department: Payer: Medicare HMO

## 2021-07-06 ENCOUNTER — Emergency Department
Admission: EM | Admit: 2021-07-06 | Discharge: 2021-07-06 | Disposition: A | Discharge status: Home / Self Care | Payer: Medicare HMO | Attending: Emergency Medicine | Admitting: Emergency Medicine

## 2021-07-06 ENCOUNTER — Encounter: Payer: Self-pay | Admitting: Emergency Medicine

## 2021-07-06 ENCOUNTER — Other Ambulatory Visit: Payer: Self-pay

## 2021-07-06 DIAGNOSIS — S3992XA Unspecified injury of lower back, initial encounter: Secondary | ICD-10-CM | POA: Diagnosis present

## 2021-07-06 DIAGNOSIS — S300XXA Contusion of lower back and pelvis, initial encounter: Secondary | ICD-10-CM | POA: Insufficient documentation

## 2021-07-06 DIAGNOSIS — Z7982 Long term (current) use of aspirin: Secondary | ICD-10-CM | POA: Insufficient documentation

## 2021-07-06 DIAGNOSIS — S20229A Contusion of unspecified back wall of thorax, initial encounter: Secondary | ICD-10-CM

## 2021-07-06 DIAGNOSIS — W19XXXA Unspecified fall, initial encounter: Secondary | ICD-10-CM

## 2021-07-06 DIAGNOSIS — E119 Type 2 diabetes mellitus without complications: Secondary | ICD-10-CM | POA: Insufficient documentation

## 2021-07-06 DIAGNOSIS — M5430 Sciatica, unspecified side: Secondary | ICD-10-CM | POA: Diagnosis not present

## 2021-07-06 DIAGNOSIS — W228XXA Striking against or struck by other objects, initial encounter: Secondary | ICD-10-CM | POA: Diagnosis not present

## 2021-07-06 DIAGNOSIS — R519 Headache, unspecified: Secondary | ICD-10-CM | POA: Insufficient documentation

## 2021-07-06 MED ORDER — PREDNISONE 50 MG PO TABS: 50.0000 mg | ORAL_TABLET | Freq: Every day | ORAL | 0 refills | Status: AC

## 2021-07-06 MED ORDER — METAXALONE 400 MG PO TABS: 400.0000 mg | ORAL_TABLET | Freq: Three times a day (TID) | ORAL | 0 refills | Status: AC

## 2021-07-06 NOTE — ED Triage Notes (Signed)
Pt reports that she was moving a few weeks back and fell into the door frame of the bathroom, She hit her head first and bounced and fell hitting her back. She took tylenol but the pain started back again this week. She called her PMD and they wanted her to come her to be evaluated.

## 2021-07-06 NOTE — ED Notes (Signed)
See triage note  Presents with lower back pain  States she fell couple of weeks ago  States she felt better until couple of days ago

## 2021-07-06 NOTE — ED Provider Notes (Signed)
[facility] Emergency Department Provider Note  ____________________________________________  Time seen: Approximately 5:11 PM  I have reviewed the triage vital signs and the nursing notes.   HISTORY  Chief Complaint Back Pain    HPI Jackie Tucker is a 87 y.o. female who presents the emergency department complaining of back pain radiating down her right leg.  Patient states that she does have a history of sciatica/neuropathy and takes gabapentin for pain in her right leg.  2 to 3 weeks ago she was moving and was attempting to give her daughter something when she started to fall backwards.  Patient states that she believes that she lost balance while turning around and as she stumbled backwards she struck the door frame with her back.  Patient did hit her head and hit her back but did not hit the floor.  She states that she stumbled landing directly into the corner of the door frame.  Patient did not lose consciousness, again she did not have before.  She initially took Tylenol for a couple of days which seemed to ease the symptoms of headache and back pain.  She states that she thought she had been doing well until the back started increasing in pain.  She is now having increased pain running down her leg despite taking her prescribed gabapentin.  No bowel or bladder dysfunction, saddle anesthesia or paresthesias.  No repeat trauma.  No urinary or GI complaints.  She has had no headaches, vision changes, neck pain.       Past Medical History:  Diagnosis Date   Diabetes (HCC)    Sleep apnea     There are no problems to display for this patient.   Past Surgical History:  Procedure Laterality Date   BREAST EXCISIONAL BIOPSY Left YRS AGO   NEG   CESAREAN SECTION     HERNIA REPAIR     KNEE SURGERY Left    PARTIAL HYSTERECTOMY     WRIST SURGERY Bilateral     Prior to Admission medications   Medication Sig Start Date End Date Taking? Authorizing Provider   metaxalone (SKELAXIN) 400 MG tablet Take 1 tablet (400 mg total) by mouth 3 (three) times daily for 7 days. 07/06/21 07/13/21 Yes Jaice Lague, Delorise Royals, PA-C  predniSONE (DELTASONE) 50 MG tablet Take 1 tablet (50 mg total) by mouth daily with breakfast. 07/06/21  Yes Jdyn Parkerson, Delorise Royals, PA-C  ALLOPURINOL PO Take by mouth daily.    [provider]  aspirin 81 MG tablet Take 81 mg by mouth 2 (two) times daily.    [provider]  atenolol (TENORMIN) 50 MG tablet Take 50 mg by mouth daily.    [provider]  ferrous sulfate 325 (65 FE) MG tablet Take 325 mg by mouth daily with breakfast.    [provider]  furosemide (LASIX) 40 MG tablet Take 40 mg by mouth daily.    [provider]  gabapentin (NEURONTIN) 300 MG capsule Take 300 mg by mouth 3 (three) times daily.    [provider]  losartan (COZAAR) 50 MG tablet Take 50 mg by mouth daily.    [provider]  omeprazole (PRILOSEC) 20 MG capsule Take 20 mg by mouth 2 (two) times daily before a meal.    [provider]  oxyCODONE-acetaminophen (PERCOCET/ROXICET) 5-325 MG per tablet Take by mouth 2 (two) times daily.    [provider]  PARoxetine (PAXIL) 20 MG tablet Take 20 mg by mouth daily.  [provider]    Allergies Aspirin  Family History  Problem Relation Age of Onset   Breast cancer Neg Hx     Social History Social History   Tobacco Use   Smoking status: Never  Substance Use Topics   Alcohol use: No     Review of Systems  Constitutional: No fever/chills Eyes: No visual changes. No discharge ENT: No upper respiratory complaints. Cardiovascular: no chest pain. Respiratory: no cough. No SOB. Gastrointestinal: No abdominal pain.  No nausea, no vomiting.  No diarrhea.  No constipation. Genitourinary: Negative for dysuria. No hematuria Musculoskeletal: Back pain with pain radiating down the right leg Skin: Negative for rash,  abrasions, lacerations, ecchymosis. Neurological: Negative for headaches, focal weakness or numbness.  10 System ROS otherwise negative.  ____________________________________________   PHYSICAL EXAM:  VITAL SIGNS: ED Triage Vitals  Enc Vitals Group     BP 07/06/21 1641 119/76     Pulse Rate 07/06/21 1641 71     Resp 07/06/21 1641 20     Temp 07/06/21 1641 100 F (37.8 C)     Temp Source 07/06/21 1641 Oral     SpO2 07/06/21 1641 98 %     Weight 07/06/21 1642 214 lb (97.1 kg)     Height 07/06/21 1642 5\' 4"  (1.626 m)     Head Circumference --      Peak Flow --      Pain Score 07/06/21 1642 10     Pain Loc --      Pain Edu? --      Excl. in [place]? --      Constitutional: Alert and oriented. Well appearing and in no acute distress. Eyes: Conjunctivae are normal. PERRL. EOMI. Head: Atraumatic. ENT:      Ears:       Nose: No congestion/rhinnorhea.      Mouth/Throat: Mucous membranes are moist.  Neck: No stridor.  No cervical spine tenderness to palpation.  Cardiovascular: Normal rate, regular rhythm. Normal S1 and S2.  Good peripheral circulation. Respiratory: Normal respiratory effort without tachypnea or retractions. Lungs CTAB. Good air entry to the bases with no decreased or absent breath sounds. Gastrointestinal: Bowel sounds 4 quadrants. Soft and nontender to palpation. No guarding or rigidity. No palpable masses. No distention. No CVA tenderness. Musculoskeletal: Full range of motion to all extremities. No gross deformities appreciated.  Visualization of the thoracic and lumbar spine reveals no visible signs of trauma with abrasions, ecchymosis, lacerations.  No deformity.  Patient is able to ambulate at this time.  Palpation reveals some mild diffuse tenderness through the mid thoracic into the mid lumbar spine.  No palpable abnormality or step-off.  No tenderness over the SI joints. Neurologic:  Normal speech and language. No gross focal neurologic deficits are appreciated.   Skin:  Skin is warm, dry and intact. No rash noted. Psychiatric: Mood and affect are normal. Speech and behavior are normal. Patient exhibits appropriate insight and judgement.   ____________________________________________   LABS (all labs ordered are listed, but only abnormal results are displayed)  Labs Reviewed - No data to display ____________________________________________  EKG   ____________________________________________  RADIOLOGY I personally viewed and evaluated these images as part of my medical decision making, as well as reviewing the written report by the radiologist.  ED Provider Interpretation: No evidence of trauma on CT scan or x-rays.  Multiple areas of degeneration noted  DG Thoracic Spine 2 View  Result Date: 07/06/2021 CLINICAL DATA:  Recent fall with  back pain, initial encounter EXAM: THORACIC SPINE 2 VIEWS COMPARISON:  None. FINDINGS: Vertebral body height is well maintained. Multilevel osteophytic changes are seen. Degenerative changes in the cervical spine are noted as well. No paraspinal mass lesion is seen. No rib abnormality is noted. IMPRESSION: No acute abnormality noted. Electronically Signed   By: Alcide Clever M.D.   On: 07/06/2021 18:14   DG Lumbar Spine 2-3 Views  Result Date: 07/06/2021 CLINICAL DATA:  Recent fall with low back pain, initial encounter EXAM: LUMBAR SPINE - 3 VIEW COMPARISON:  None. FINDINGS: Five lumbar type vertebral bodies are well visualized. Vertebral body height is well maintained. No anterolisthesis is noted. Disc space narrowing from L2-S1 is seen with generalized osteophytic changes. Facet hypertrophic changes are noted as well. IMPRESSION: Degenerative change without acute abnormality. Electronically Signed   By: Alcide Clever M.D.   On: 07/06/2021 18:14   CT HEAD WO CONTRAST ( )  Result Date: 07/06/2021 CLINICAL DATA:  Polytrauma, critical, head/C-spine injury suspected Hit head and back, worsening symptoms for 2  weeks EXAM: CT HEAD WITHOUT CONTRAST TECHNIQUE: Contiguous axial images were obtained from the base of the skull through the vertex without intravenous contrast. COMPARISON:  Brain MRI 07/26/2020 FINDINGS: Brain: Brain volume is normal for age. No intracranial hemorrhage, mass effect, or midline shift. No hydrocephalus. The basilar cisterns are patent. Minimal periventricular chronic small vessel ischemia. No evidence of territorial infarct or acute ischemia. No extra-axial or intracranial fluid collection. Vascular: Atherosclerosis of skullbase vasculature without hyperdense vessel or abnormal calcification. Skull: No fracture or focal lesion. Sinuses/Orbits: Mucosal thickening of ethmoid air cells no sinus fluid levels or evidence of fracture. Orbits are unremarkable. No mastoid effusion. Other: None. IMPRESSION: 1. No acute intracranial abnormality. No skull fracture. 2. Age related atrophy with mild chronic small vessel ischemia. Mild paranasal sinus disease. Electronically Signed   By: Narda Rutherford M.D.   On: 07/06/2021 18:01   CT Cervical Spine Wo Contrast  Result Date: 07/06/2021 CLINICAL DATA:  Polytrauma, critical, head/C-spine injury suspected Fall striking head. EXAM: CT CERVICAL SPINE WITHOUT CONTRAST TECHNIQUE: Multidetector CT imaging of the cervical spine was performed without intravenous contrast. Multiplanar CT image reconstructions were also generated. COMPARISON:  None. FINDINGS: Alignment: Straightening of normal lordosis. No traumatic subluxation. Skull base and vertebrae: No acute fracture. Vertebral body heights are maintained. The dens and skull base are intact. Soft tissues and spinal canal: No prevertebral fluid or swelling. No visible canal hematoma. Disc levels: Multilevel degenerative disc disease with disc space narrowing and endplate spurring, most prominent at C2-C3, C6-C7 and C7-T1. There is multilevel facet hypertrophy. Multifactorial canal stenosis at C2-C3, C6-C7, and  C7-T1. Multilevel bony neural foraminal narrowing Upper chest: No acute or unexpected findings. Other: None. IMPRESSION: 1. No acute fracture or subluxation of the cervical spine. 2. Multilevel degenerative disc disease and facet hypertrophy throughout the cervical spine. Multifactorial canal stenosis at C2-C3, C6-C7, and C7-T1. Multilevel bony neural foraminal narrowing. Electronically Signed   By: Narda Rutherford M.D.   On: 07/06/2021 18:31    ____________________________________________    PROCEDURES  Procedure(s) performed:    Procedures    Medications - No data to display   ____________________________________________   INITIAL IMPRESSION / ASSESSMENT AND PLAN / ED COURSE  Pertinent labs & imaging results that were available during my care of the patient were reviewed by me and considered in my medical decision making (see chart for details).  Review of the [place] CSRS was performed in accordance of the  [facility] prior to dispensing any controlled drugs.           Patient's diagnosis is consistent with contusion of the back, sciatica.  Patient presented to the emergency department for sustaining a fall 2 to 3 weeks ago.  She had some increased back pain after this encounter.  She had fallen against a door jam and not onto the ground itself.  She does have a history of sciatica and takes gabapentin for same but the symptoms had been improving recently and then worsened.  She had no bowel or bladder dysfunction, saddle anesthesia or paresthesias.  Exam was reassuring at this time.  Imaging revealed no acute traumatic findings.  There is no indication for MRI at this time.  Patient be treated symptomatically.  She does have chronic kidney disease as well as diabetes.  Weighing the benefits versus risk I feel that the patient would be better suited with prednisone and an NSAID as well as a low-dose muscle relaxer.  We discussed checking blood sugars and managing rising blood sugars to  include returning to the ED if the sugars become too high.  However I feel that a short course of steroid would be better tolerated than possible renal injury from NSAIDs.  Extreme caution on muscle relaxers caution with the patient..  Follow-up with primary care as needed.  Patient is given ED precautions to return to the ED for any worsening or new symptoms.     ____________________________________________  FINAL CLINICAL IMPRESSION(S) / ED DIAGNOSES  Final diagnoses:  Fall, initial encounter  Contusion of back, unspecified laterality, initial encounter  Sciatica, unspecified laterality      NEW MEDICATIONS STARTED DURING THIS VISIT:  ED Discharge Orders          Ordered    predniSONE (DELTASONE) 50 MG tablet  Daily with breakfast        07/06/21 2008    metaxalone (SKELAXIN) 400 MG tablet  3 times daily        07/06/21 2008                This chart was dictated using voice recognition software/Dragon. Despite best efforts to proofread, errors can occur which can change the meaning. Any change was purely unintentional.    Lanette HampshireCuthriell, Vale Mousseau D, PA-C 07/06/21 2012    Delton PrairieSmith, Dylan, MD 07/07/21 0003

## 2021-07-17 ENCOUNTER — Other Ambulatory Visit: Payer: Self-pay | Admitting: Internal Medicine

## 2021-07-17 DIAGNOSIS — Z1231 Encounter for screening mammogram for malignant neoplasm of breast: Secondary | ICD-10-CM

## 2022-04-23 ENCOUNTER — Other Ambulatory Visit: Payer: Self-pay | Admitting: Internal Medicine

## 2022-04-23 DIAGNOSIS — Z1231 Encounter for screening mammogram for malignant neoplasm of breast: Secondary | ICD-10-CM

## 2022-12-27 ENCOUNTER — Other Ambulatory Visit: Payer: Self-pay | Admitting: Internal Medicine

## 2022-12-27 DIAGNOSIS — M25473 Effusion, unspecified ankle: Secondary | ICD-10-CM

## 2022-12-27 DIAGNOSIS — M79604 Pain in right leg: Secondary | ICD-10-CM

## 2023-01-01 ENCOUNTER — Ambulatory Visit
Admission: RE | Admit: 2023-01-01 | Discharge: 2023-01-01 | Disposition: A | Discharge status: Home / Self Care | Payer: Medicare HMO | Source: Ambulatory Visit | Attending: Internal Medicine | Admitting: Internal Medicine

## 2023-01-01 DIAGNOSIS — M25473 Effusion, unspecified ankle: Secondary | ICD-10-CM | POA: Insufficient documentation

## 2023-01-01 DIAGNOSIS — M79604 Pain in right leg: Secondary | ICD-10-CM | POA: Insufficient documentation

## 2023-01-07 ENCOUNTER — Emergency Department: Payer: Medicare HMO

## 2023-01-07 ENCOUNTER — Emergency Department
Admission: EM | Admit: 2023-01-07 | Discharge: 2023-01-07 | Disposition: A | Discharge status: Home / Self Care | Payer: Medicare HMO | Attending: Emergency Medicine | Admitting: Emergency Medicine

## 2023-01-07 DIAGNOSIS — W06XXXA Fall from bed, initial encounter: Secondary | ICD-10-CM | POA: Diagnosis not present

## 2023-01-07 DIAGNOSIS — R519 Headache, unspecified: Secondary | ICD-10-CM | POA: Diagnosis not present

## 2023-01-07 DIAGNOSIS — M542 Cervicalgia: Secondary | ICD-10-CM | POA: Diagnosis not present

## 2023-01-07 DIAGNOSIS — M25551 Pain in right hip: Secondary | ICD-10-CM | POA: Diagnosis not present

## 2023-01-07 DIAGNOSIS — W19XXXA Unspecified fall, initial encounter: Secondary | ICD-10-CM

## 2023-01-07 DIAGNOSIS — Y92003 Bedroom of unspecified non-institutional (private) residence as the place of occurrence of the external cause: Secondary | ICD-10-CM | POA: Diagnosis not present

## 2023-01-07 LAB — URINALYSIS, ROUTINE W REFLEX MICROSCOPIC
Bacteria, UA: NONE SEEN
Bilirubin Urine: NEGATIVE
Glucose, UA: NEGATIVE mg/dL
Hgb urine dipstick: NEGATIVE
Ketones, ur: NEGATIVE mg/dL
Nitrite: NEGATIVE
Protein, ur: NEGATIVE mg/dL
Specific Gravity, Urine: 1.019 (ref 1.005–1.030)
pH: 5 (ref 5.0–8.0)

## 2023-01-07 NOTE — ED Triage Notes (Signed)
Pt presents to the ED via pov due to a fall. Pt states she was dreaming and fell out of bed. Pt denies LOC. Pt denies blood thinners. Pty c/o R hip, neck and head pain. Pt denies any visual changes. Pt A&Ox4

## 2023-01-07 NOTE — Discharge Instructions (Signed)
Your urinalysis shows no signs of UTI. All the imaging obtained in the emergency department is reassuring.

## 2023-01-07 NOTE — ED Provider Notes (Signed)
[facility] Provider Note  Patient Contact: 5:11 PM (approximate)   History   Fall   HPI  Jackie Tucker is a 89 y.o. female presents to the emergency department after a mechanical fall.  Patient states that she was training of going to the bathroom and fell getting out of bed.  She initially had some head pain, neck pain and right hip pain.  Patient has been able to ambulate easily since the fall occurred.  No chest pain, chest tightness or abdominal pain.  No lacerations or abrasions.      Physical Exam   Triage Vital Signs: ED Triage Vitals  Enc Vitals Group     BP 01/07/23 1436 (!) 145/58     Pulse Rate 01/07/23 1436 60     Resp 01/07/23 1436 18     Temp 01/07/23 1436 98.7 F (37.1 C)     Temp Source 01/07/23 1436 Oral     SpO2 01/07/23 1436 96 %     Weight 01/07/23 1434 214 lb 1.1 oz (97.1 kg)     Height 01/07/23 1434 5' 4"$  (1.626 m)     Head Circumference --      Peak Flow --      Pain Score 01/07/23 1433 9     Pain Loc --      Pain Edu? --      Excl. in [place]? --     Most recent vital signs: Vitals:   01/07/23 1436 01/07/23 1823  BP: (!) 145/58 (!) 148/75  Pulse: 60 62  Resp: 18 18  Temp: 98.7 F (37.1 C)   SpO2: 96% 97%     General: Alert and in no acute distress. Eyes:  PERRL. EOMI. Head: No acute traumatic findings ENT:      Nose: No congestion/rhinnorhea.      Mouth/Throat: Mucous membranes are moist. Neck: No stridor. No cervical spine tenderness to palpation. Cardiovascular:  Good peripheral perfusion Respiratory: Normal respiratory effort without tachypnea or retractions. Lungs CTAB. Good air entry to the bases with no decreased or absent breath sounds. Gastrointestinal: Bowel sounds 4 quadrants. Soft and nontender to palpation. No guarding or rigidity. No palpable masses. No distention. No CVA tenderness. Musculoskeletal: Full range of motion to all extremities.  Neurologic:  No gross focal neurologic deficits are  appreciated.  Skin:   No rash noted    ED Results / Procedures / Treatments   Labs (all labs ordered are listed, but only abnormal results are displayed) Labs Reviewed  URINALYSIS, ROUTINE W REFLEX MICROSCOPIC - Abnormal; Notable for the following components:      Result Value   Color, Urine YELLOW (*)    APPearance CLEAR (*)    Leukocytes,Ua TRACE (*)    All other components within normal limits       RADIOLOGY  I personally viewed and evaluated these images as part of my medical decision making, as well as reviewing the written report by the radiologist.  ED Provider Interpretation: X-rays of the right hip show no acute abnormality.  No evidence of intracranial bleed, skull fracture or C-spine fracture on dedicated CTs.   PROCEDURES:  Critical Care performed: No  Procedures   MEDICATIONS ORDERED IN ED: Medications - No data to display   IMPRESSION / MDM / [place] / ED COURSE  I reviewed the triage vital signs and the nursing notes.  Assessment and plan: Fall:  89 year old female presents to the emergency department after a mechanical fall.  Vital signs are reassuring at triage.  On exam, patient was alert and nontoxic-appearing with no neurodeficits noted.  Patient was able to stand and ambulate easily.  CTs of the head and cervical spine showed no acute abnormality.  No acute fracture on x-ray of the right hip.  Urinalysis shows no signs of UTI.  I did review labs obtained from January 30th which were overall very reassuring.  Supportive measures were encouraged for home use.  Return precautions were given to return with new or worsening symptoms.     FINAL CLINICAL IMPRESSION(S) / ED DIAGNOSES   Final diagnoses:  Fall, initial encounter     Rx / DC Orders   ED Discharge Orders     None        Note:  This document was prepared using Dragon voice recognition software and may include unintentional  dictation errors.   Vallarie Mare Frost, PA-C 01/07/23 2348    Arta Silence, MD 01/08/23 [phone]

## 2023-05-26 ENCOUNTER — Emergency Department
Admission: EM | Admit: 2023-05-26 | Discharge: 2023-05-26 | Disposition: A | Discharge status: Home / Self Care | Payer: Medicare (Managed Care) | Source: Home / Self Care | Attending: Emergency Medicine | Admitting: Emergency Medicine

## 2023-05-26 ENCOUNTER — Emergency Department: Payer: Medicare (Managed Care)

## 2023-05-26 ENCOUNTER — Other Ambulatory Visit: Payer: Self-pay

## 2023-05-26 DIAGNOSIS — K589 Irritable bowel syndrome without diarrhea: Secondary | ICD-10-CM | POA: Insufficient documentation

## 2023-05-26 DIAGNOSIS — K5730 Diverticulosis of large intestine without perforation or abscess without bleeding: Secondary | ICD-10-CM | POA: Insufficient documentation

## 2023-05-26 DIAGNOSIS — R1032 Left lower quadrant pain: Secondary | ICD-10-CM

## 2023-05-26 DIAGNOSIS — E119 Type 2 diabetes mellitus without complications: Secondary | ICD-10-CM | POA: Insufficient documentation

## 2023-05-26 DIAGNOSIS — Z7982 Long term (current) use of aspirin: Secondary | ICD-10-CM | POA: Diagnosis not present

## 2023-05-26 LAB — CBC
HCT: 36.6 % (ref 36.0–46.0)
Hemoglobin: 11.7 g/dL — ABNORMAL LOW (ref 12.0–15.0)
MCH: 28.5 pg (ref 26.0–34.0)
MCHC: 32 g/dL (ref 30.0–36.0)
MCV: 89.1 fL (ref 80.0–100.0)
Platelets: 213 10*3/uL (ref 150–400)
RBC: 4.11 MIL/uL (ref 3.87–5.11)
RDW: 15 % (ref 11.5–15.5)
WBC: 6.1 10*3/uL (ref 4.0–10.5)
nRBC: 0 % (ref 0.0–0.2)

## 2023-05-26 LAB — COMPREHENSIVE METABOLIC PANEL
ALT: 13 U/L (ref 0–44)
AST: 20 U/L (ref 15–41)
Albumin: 4 g/dL (ref 3.5–5.0)
Alkaline Phosphatase: 73 U/L (ref 38–126)
Anion gap: 8 (ref 5–15)
BUN: 22 mg/dL (ref 8–23)
CO2: 25 mmol/L (ref 22–32)
Calcium: 9.9 mg/dL (ref 8.9–10.3)
Chloride: 104 mmol/L (ref 98–111)
Creatinine, Ser: 1.27 mg/dL — ABNORMAL HIGH (ref 0.44–1.00)
GFR, Estimated: 41 mL/min — ABNORMAL LOW (ref >60)
Glucose, Bld: 119 mg/dL — ABNORMAL HIGH (ref 70–99)
Potassium: 3.8 mmol/L (ref 3.5–5.1)
Sodium: 137 mmol/L (ref 135–145)
Total Bilirubin: 0.7 mg/dL (ref 0.3–1.2)
Total Protein: 7.2 g/dL (ref 6.5–8.1)

## 2023-05-26 LAB — URINALYSIS, ROUTINE W REFLEX MICROSCOPIC
Bacteria, UA: NONE SEEN
Bilirubin Urine: NEGATIVE
Glucose, UA: NEGATIVE mg/dL
Hgb urine dipstick: NEGATIVE
Ketones, ur: NEGATIVE mg/dL
Nitrite: NEGATIVE
Protein, ur: NEGATIVE mg/dL
Specific Gravity, Urine: 1.014 (ref 1.005–1.030)
pH: 7 (ref 5.0–8.0)

## 2023-05-26 LAB — LIPASE, BLOOD
Lipase: 33 U/L (ref 11–51)
Lipase: 33 U/L (ref 11–51)

## 2023-05-26 MED ORDER — DICYCLOMINE HCL 10 MG PO CAPS: 10.0000 mg | ORAL_CAPSULE | Freq: Three times a day (TID) | ORAL | 0 refills | Status: AC | PRN

## 2023-05-26 NOTE — Discharge Instructions (Signed)
You have been seen today in the emergency room for left lower quadrant pain.  Your labs are relatively normal.  Your CT scan shows that you do have scattered diverticula.  Based on your assessment and report of pain, I will be prescribing you an antispasmodic medication that you can take up to 3 times a day as needed for abdominal pain.  You should follow-up with your primary care doctor within the next week to discuss the effectiveness of this medication.  If you begin to have increased pain, blood in stool, fever then you should report back to the emergency room promptly.

## 2023-05-26 NOTE — ED Triage Notes (Signed)
Pt sts that she has been having left sided abd pain. Pt sts that it has been going on for a couple of days now. Pt sts that she also has been having normal BM's however she has been dry heaving when using the restroom.

## 2023-05-26 NOTE — ED Notes (Signed)
Pt verbalizes understanding of discharge instructions. Opportunity for questioning and answers were provided. Pt discharged from ED to home with daughter.    

## 2023-05-26 NOTE — ED Provider Notes (Signed)
[facility] Emergency Department Provider Note   ____________________________________________   Event Date/Time   First MD Initiated Contact with Patient 05/26/23 1423     (approximate)  I have reviewed the triage vital signs and the nursing notes.   HISTORY  Chief Complaint Abdominal Pain    HPI HANNAHMARIE Tucker is a 89 y.o. female presents to the emergency room for complaint of left lower quadrant pain.  Patient reports that the pain has been present for the last week.  However, has been worse over the past 48 hours.  Patient also reports that she is having dry heaves after urinating in the mornings.  Otherwise, patient states that she is eating/drinking normally.  She is urinating without frequency/urgency/pain..  She reports that she does have some dysuria mostly in the morning.  Patient denies vomiting, diarrhea.  She does endorse intermittent constipation which she has to take laxatives to relieve.  At this time patient reports her pain as a 3 out of 10.  She has not taken anything for the pain today.   Past Medical History:  Diagnosis Date   Diabetes (HCC)    Sleep apnea     There are no problems to display for this patient.   Past Surgical History:  Procedure Laterality Date   BREAST EXCISIONAL BIOPSY Left YRS AGO   NEG   CESAREAN SECTION     HERNIA REPAIR     KNEE SURGERY Left    PARTIAL HYSTERECTOMY     WRIST SURGERY Bilateral     Prior to Admission medications   Medication Sig Start Date End Date Taking? Authorizing Provider  dicyclomine (BENTYL) 10 MG capsule Take 1 capsule (10 mg total) by mouth 3 (three) times daily with meals as needed for spasms. 05/26/23  Yes Herschell Dimes, NP  ALLOPURINOL PO Take by mouth daily.    [provider]  aspirin 81 MG tablet Take 81 mg by mouth 2 (two) times daily.    [provider]  atenolol (TENORMIN) 50 MG tablet Take 50 mg by mouth daily.    [provider]  ferrous  sulfate 325 (65 FE) MG tablet Take 325 mg by mouth daily with breakfast.    [provider]  furosemide (LASIX) 40 MG tablet Take 40 mg by mouth daily.    [provider]  gabapentin (NEURONTIN) 300 MG capsule Take 300 mg by mouth 3 (three) times daily.    [provider]  losartan (COZAAR) 50 MG tablet Take 50 mg by mouth daily.    [provider]  omeprazole (PRILOSEC) 20 MG capsule Take 20 mg by mouth 2 (two) times daily before a meal.    [provider]  oxyCODONE-acetaminophen (PERCOCET/ROXICET) 5-325 MG per tablet Take by mouth 2 (two) times daily.    [provider]  PARoxetine (PAXIL) 20 MG tablet Take 20 mg by mouth daily.    [provider]  predniSONE (DELTASONE) 50 MG tablet Take 1 tablet (50 mg total) by mouth daily with breakfast. 07/06/21   Cuthriell, Delorise Royals, PA-C    Allergies Aspirin  Family History  Problem Relation Age of Onset   Breast cancer Neg Hx     Social History Social History   Tobacco Use   Smoking status: Never  Substance Use Topics   Alcohol use: No    Review of Systems  Constitutional: No fever/chills Eyes: No visual changes. Cardiovascular: Denies chest pain. Respiratory: Denies shortness of breath. Gastrointestinal:  Positive for left lower quadrant positive for dry heaves and nausea.  No vomiting.  No diarrhea.  No constipation. Genitourinary: Positive for dysuria Musculoskeletal: Negative for back pain. Skin: Negative for rash. Neurological: Negative for headaches, focal weakness or numbness.   ____________________________________________   PHYSICAL EXAM:  VITAL SIGNS: ED Triage Vitals [05/26/23 1156]  Enc Vitals Group     BP (!) 149/64     Pulse Rate 60     Resp 17     Temp 97.9 F (36.6 C)     Temp Source Oral     SpO2 96 %     Weight 215 lb (97.5 kg)     Height 5\' 4"  (1.626 m)     Head Circumference      Peak Flow      Pain Score 4     Pain Loc      Pain  Edu?      Excl. in GC?     Constitutional: Alert and oriented. Well appearing and in no acute distress. Eyes: Conjunctivae are normal. PERRL. EOMI. Head: Atraumatic. Mouth/Throat: Mucous membranes are moist.   Neck: No stridor.   Cardiovascular: Normal rate, regular rhythm. Grossly normal heart sounds.  Good peripheral circulation. Respiratory: Normal respiratory effort.  No retractions. Lungs CTAB. Gastrointestinal: Some tenderness to palpation over the left lower quadrant.  No distention. No abdominal bruits.  Genitourinary: Positive for dysuria.  Negative for frequency/urgency/burning with urination. Musculoskeletal: No lower extremity tenderness nor edema.  No joint effusions. Neurologic:  Normal speech and language. No gross focal neurologic deficits are appreciated.  Skin:  Skin is warm, dry and intact. No rash noted. Psychiatric: Mood and affect are normal. Speech and behavior are normal.  ____________________________________________   LABS (all labs ordered are listed, but only abnormal results are displayed)  Labs Reviewed  COMPREHENSIVE METABOLIC PANEL - Abnormal; Notable for the following components:      Result Value   Glucose, Bld 119 (*)    Creatinine, Ser 1.27 (*)    GFR, Estimated 41 (*)    All other components within normal limits  CBC - Abnormal; Notable for the following components:   Hemoglobin 11.7 (*)    All other components within normal limits  URINALYSIS, ROUTINE W REFLEX MICROSCOPIC - Abnormal; Notable for the following components:   Color, Urine YELLOW (*)    APPearance CLEAR (*)    Leukocytes,Ua TRACE (*)    All other components within normal limits  LIPASE, BLOOD  LIPASE, BLOOD   ____________________________________________  EKG   ____________________________________________  RADIOLOGY  ED MD interpretation: CT of abdomen reviewed by me and read by radiologist.  Official radiology report(s): CT ABDOMEN PELVIS WO CONTRAST  Result  Date: 05/26/2023 CLINICAL DATA:  Abdominal pain. EXAM: CT ABDOMEN AND PELVIS WITHOUT CONTRAST TECHNIQUE: Multidetector CT imaging of the abdomen and pelvis was performed following the standard protocol without IV contrast. RADIATION DOSE REDUCTION: This exam was performed according to the departmental dose-optimization program which includes automated exposure control, adjustment of the mA and/or kV according to patient size and/or use of iterative reconstruction technique. COMPARISON:  None Available. FINDINGS: Lower chest: No acute abnormality. Hepatobiliary: No focal liver abnormality is seen. No gallstones, gallbladder wall thickening, or biliary dilatation. Pancreas: No mass or inflammation. Partial fatty replacement of the pancreatic tail. Medial diverticulum from the third portion of the duodenum abuts the pancreatic head. Spleen: Normal in size without focal abnormality. Adrenals/Urinary Tract: No adrenal masses. Kidneys are normal in overall size  and position. Chronic appearing bilateral perinephric stranding. No renal mass or stone. No hydronephrosis. Unremarkable ureters and bladder. Stomach/Bowel: Unremarkable stomach. Duodenal diverticula. Small bowel and colon are normal in caliber. No wall thickening or inflammation. Scattered colonic diverticula. Normal appendix visualized. Vascular/Lymphatic: Mild aortic atherosclerosis. No aneurysm. No enlarged lymph nodes. Reproductive: Status post hysterectomy. No adnexal masses. Other: Rectus abdominus muscle diastasis. Fascia protrudes anteriorly and inferiorly between the diastatic rectus abdominus muscles, but is intact. There is calcification along the fascia in the lower abdomen. No convincing hernia. No ascites. Musculoskeletal: No fracture or acute finding.  No bone lesion. IMPRESSION: 1. No acute findings within the abdomen or pelvis. 2. Mild aortic atherosclerosis. Scattered colonic diverticula without evidence of diverticulitis. Electronically Signed    By: Amie Portland M.D.   On: 05/26/2023 13:37    ____________________________________________   PROCEDURES  Procedure(s) performed: None  Procedures  Critical Care performed: No  ____________________________________________   INITIAL IMPRESSION / ASSESSMENT AND PLAN / ED COURSE     AMAANI HAROLDSEN is a 89 y.o. female presents to the emergency room for complaint of left lower quadrant pain.  Patient reports that the pain has been present for the last week.  However, has been worse over the past 48 hours.  Patient also reports that she is having dry heaves after urinating in the mornings.  Otherwise, patient states that she is eating/drinking normally.  She is urinating without frequency/urgency/pain..  She reports that she does have some dysuria mostly in the morning.  Patient denies vomiting, diarrhea.  She does endorse intermittent constipation which she has to take laxatives to relieve.  At this time patient reports her pain as a 3 out of 10.  She has not taken anything for the pain today.   CT of abdomen shows no acute/emergent findings.  However there is scattered colonic diverticula without evidence of diverticulitis. Patient denies any blood in stool.  Awaiting UA and will add on lipase . UA is relatively unremarkable and lipase is normal.  CBC relatively normal.  CMP with slightly elevated glucose.  Also reveals elevated creatinine and decreased GFR which is consistent with her diagnosis of chronic kidney disease and appears to be stable.  Urinalysis shows trace of leukocytes. Awaiting lipase.- Lipase normal   On reassessment of patient, she reports that she is no longer having abdominal pain.  She reports that she has a "tickle" in her left lower quadrant. Based on CT finding of colonic diverticula and up-to-date review/recommendations, patient will be discharged home with prescription for Bentyl to be used 3 times a day as needed for abdominal pain.  Patient then needs to  follow-up with her primary care provider for effectiveness.  Red flags for reasons to return to the emergency room were discussed with patient. Will discharge patient home in stable condition at this time.       ____________________________________________   FINAL CLINICAL IMPRESSION(S) / ED DIAGNOSES  Final diagnoses:  Left lower quadrant abdominal pain  Spasm of colon  Diverticula of colon     ED Discharge Orders          Ordered    dicyclomine (BENTYL) 10 MG capsule  3 times daily with meals PRN        05/26/23 1711             Note:  This document was prepared using Dragon voice recognition software and may include unintentional dictation errors.     Herschell Dimes, NP 05/26/23 1715  Phineas Semen, MD 05/28/23 [id number]

## 2023-06-14 IMAGING — CT CT CERVICAL SPINE W/O CM
3 of 4 series · 12 of 33 positions shown, 14 images · non-contrast
Comparison: None.

CLINICAL DATA: Polytrauma, critical, head/C-spine injury suspected

Fall striking head.
EXAM:
CT CERVICAL SPINE WITHOUT CONTRAST
TECHNIQUE: Multidetector CT imaging of the cervical spine was performed without
intravenous contrast. Multiplanar CT image reconstructions were also
generated.

[Series 6: sagittal bone · sagittal · 0.25mm/px · 5 of 88 slices shown, 6 images]
[im 30/88  bone]
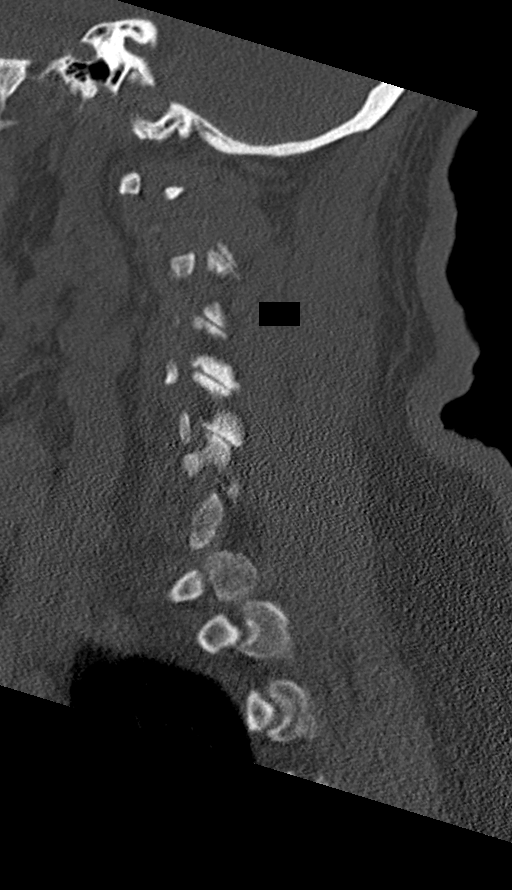
[im 37/88  bone]
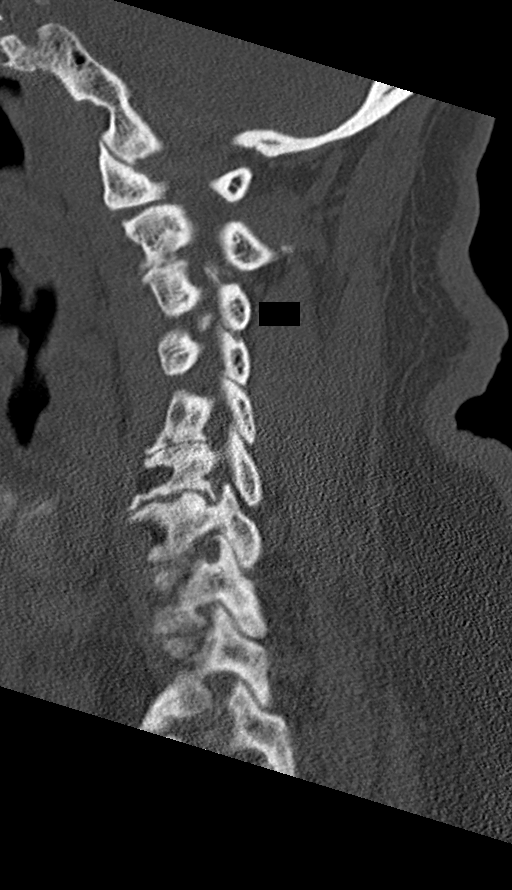
[im 44/88  soft-tissue]
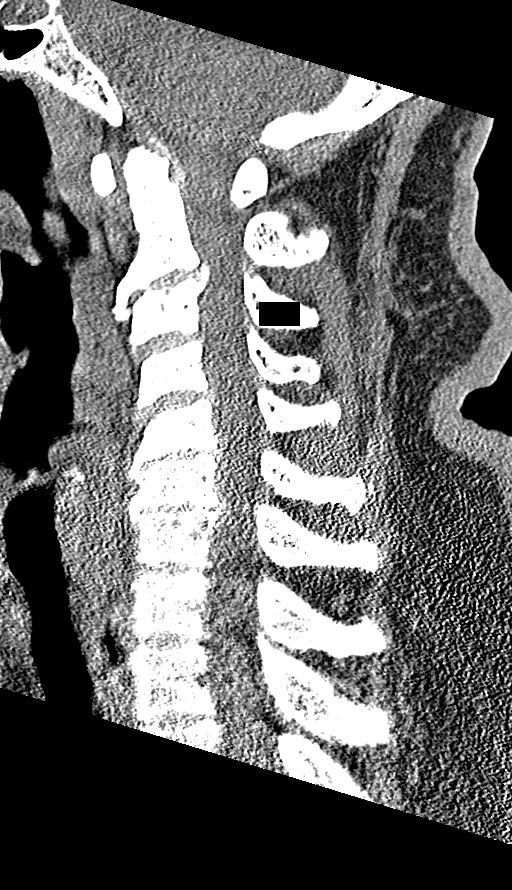
[im 44/88  bone]
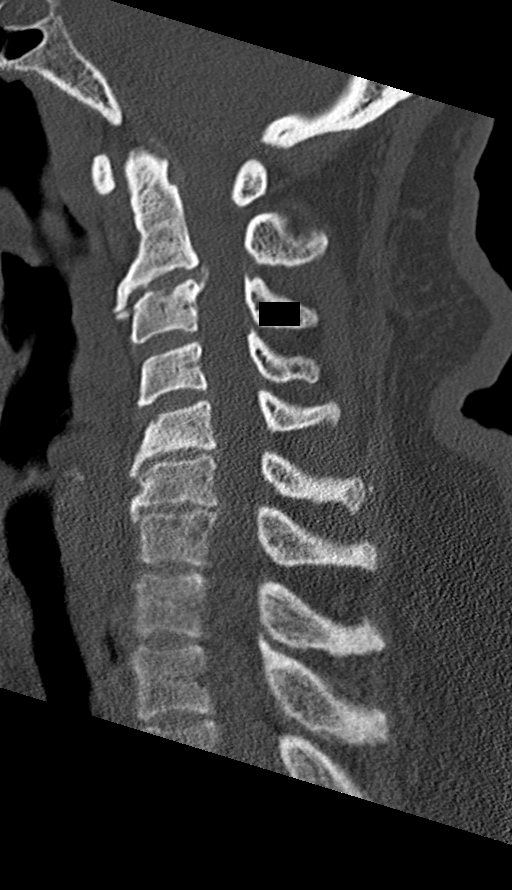
[im 51/88  bone]
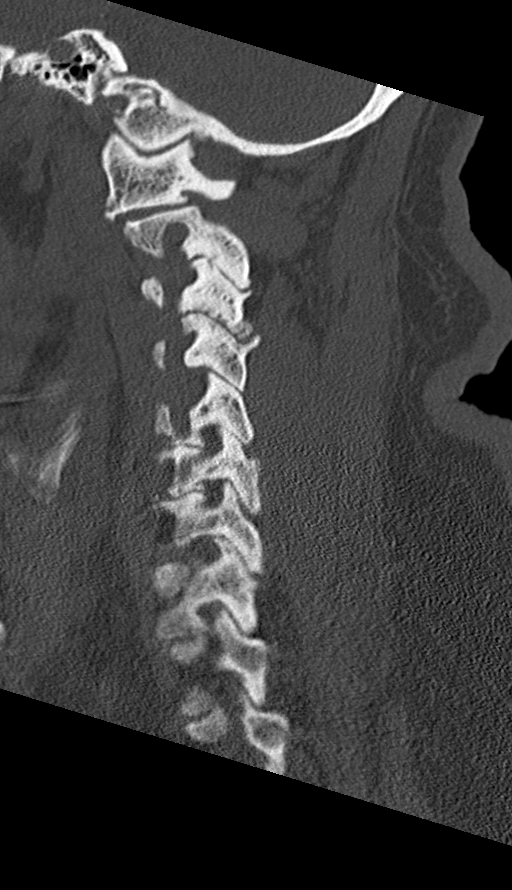
[im 59/88  bone]
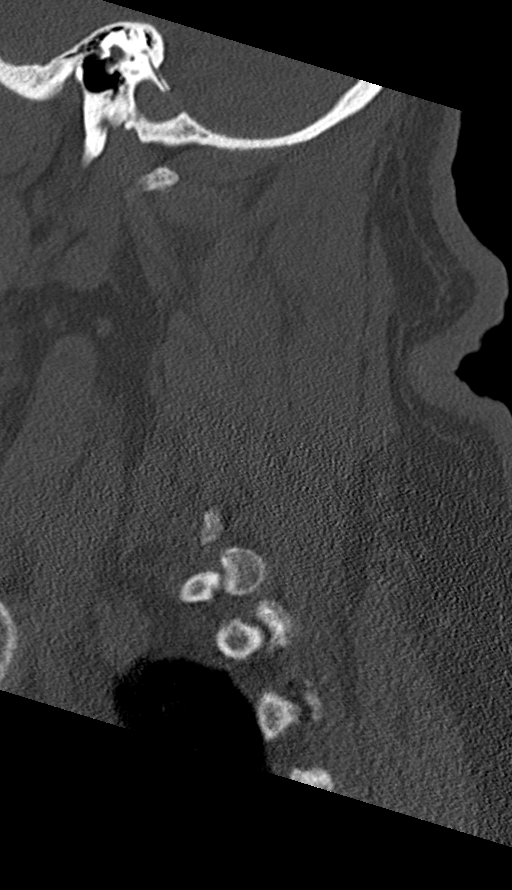

[Series 7: coronal bone · coronal · 0.29mm/px · 3 of 64 slices shown]
[im 13/64  bone]
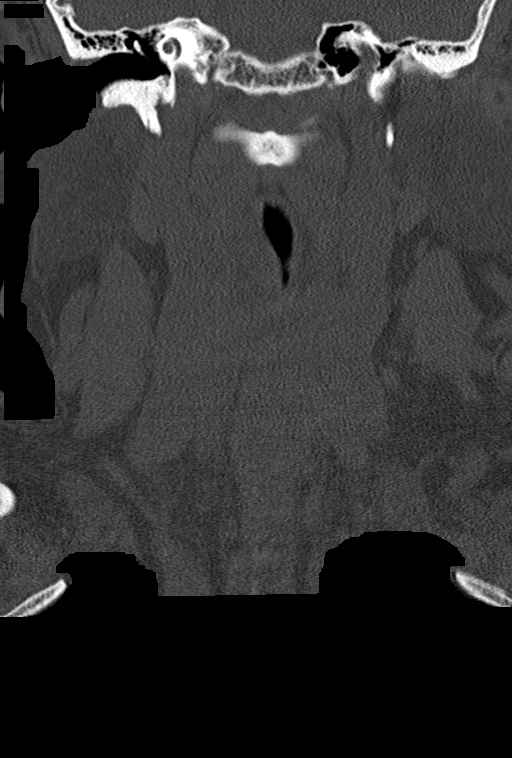
[im 26/64  bone]
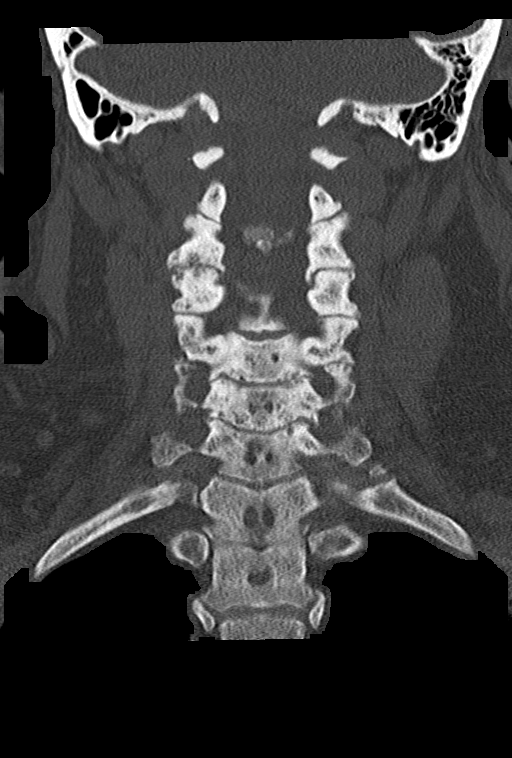
[im 38/64  bone]
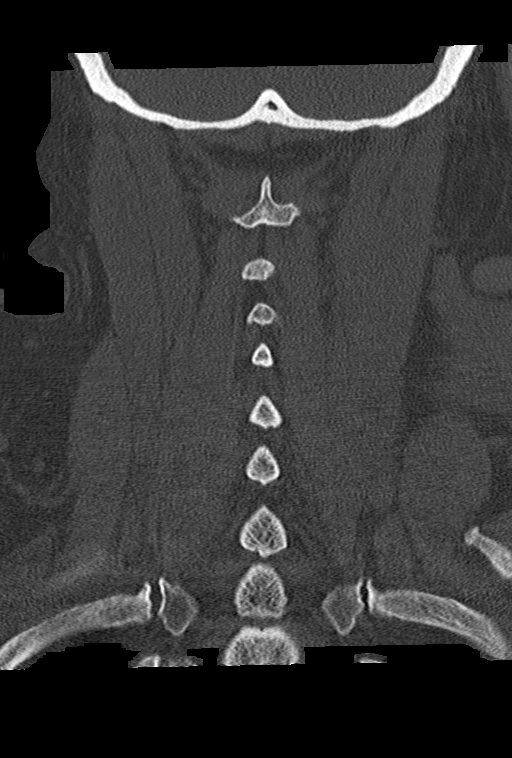

[Series 8: orthogonal bone · axial · 0.25mm/px · z∈[-310,-164]mm · 4 of 108 slices shown, 5 images]
[im 16/108  soft-tissue]
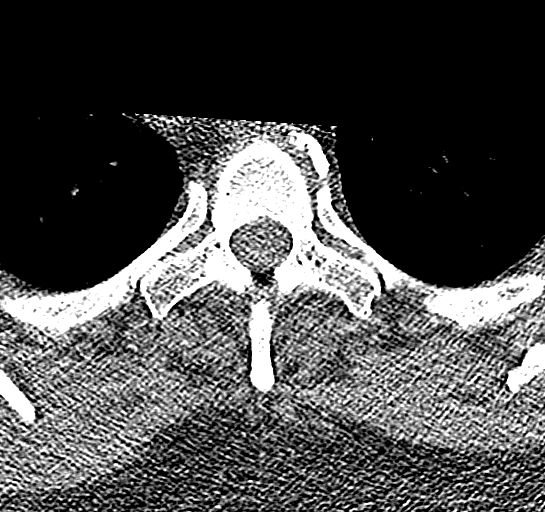
[im 16/108  bone]
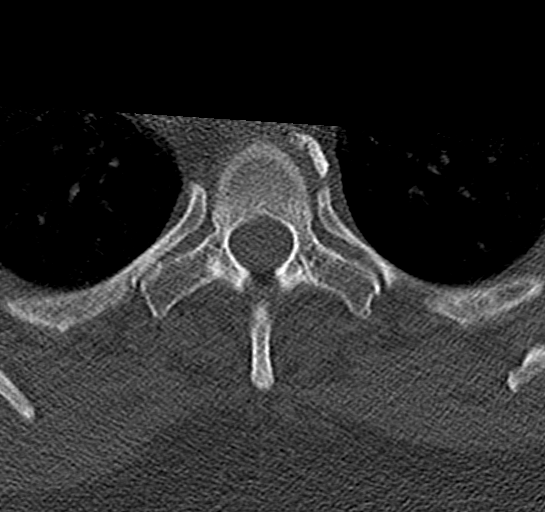
[im 46/108  bone]
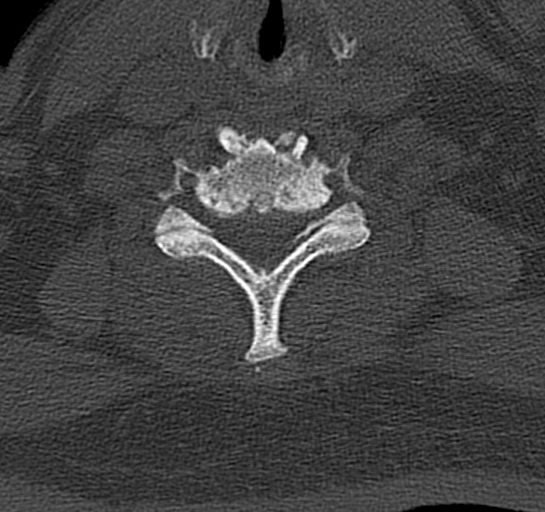
[im 62/108  bone]
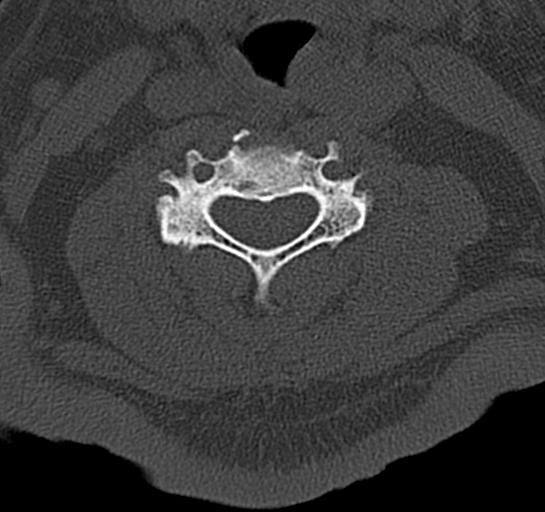
[im 92/108  bone]
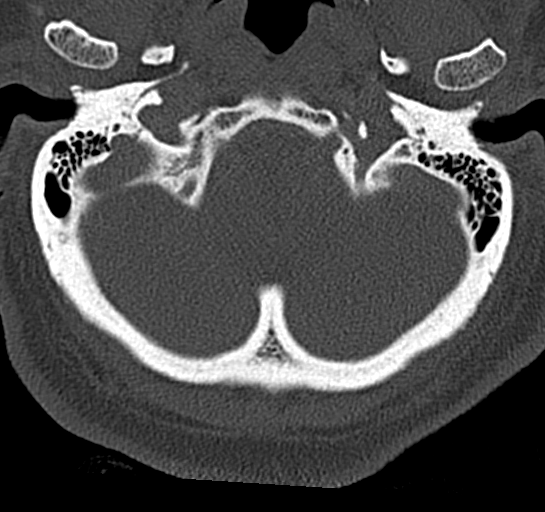

[12 of 33 positions shown; findings below may reference images not displayed]

FINDINGS: Alignment: Straightening of normal lordosis. No traumatic
subluxation.

Skull base and vertebrae: No acute fracture. Vertebral body heights
are maintained. The dens and skull base are intact.

Soft tissues and spinal canal: No prevertebral fluid or swelling. No
visible canal hematoma.

Disc levels: Multilevel degenerative disc disease with disc space
narrowing and endplate spurring, most prominent at C2-C3, C6-C7 and
C7-T1. There is multilevel facet hypertrophy. Multifactorial canal
stenosis at C2-C3, C6-C7, and C7-T1. Multilevel bony neural
foraminal narrowing

Upper chest: No acute or unexpected findings.

Other: None.
IMPRESSION: 1. No acute fracture or subluxation of the cervical spine.
2. Multilevel degenerative disc disease and facet hypertrophy
throughout the cervical spine. Multifactorial canal stenosis at
C2-C3, C6-C7, and C7-T1. Multilevel bony neural foraminal narrowing.

## 2023-10-24 ENCOUNTER — Other Ambulatory Visit: Payer: Self-pay

## 2023-10-24 ENCOUNTER — Encounter: Payer: Self-pay | Admitting: Medical Oncology

## 2023-10-24 ENCOUNTER — Emergency Department
Admission: EM | Admit: 2023-10-24 | Discharge: 2023-10-24 | Disposition: A | Discharge status: Home / Self Care | Payer: Medicare (Managed Care) | Attending: Emergency Medicine | Admitting: Emergency Medicine

## 2023-10-24 ENCOUNTER — Emergency Department: Payer: Medicare (Managed Care)

## 2023-10-24 DIAGNOSIS — M25552 Pain in left hip: Secondary | ICD-10-CM | POA: Diagnosis not present

## 2023-10-24 DIAGNOSIS — M545 Low back pain, unspecified: Secondary | ICD-10-CM | POA: Diagnosis present

## 2023-10-24 MED ORDER — LIDOCAINE 5 % EX PTCH
1.0000 | MEDICATED_PATCH | CUTANEOUS | Status: DC
  Administered 2023-10-24: 1 via TRANSDERMAL
  Filled 2023-10-24: qty 1

## 2023-10-24 MED ORDER — LIDOCAINE 5 % EX PTCH: 1.0000 | MEDICATED_PATCH | Freq: Two times a day (BID) | CUTANEOUS | 0 refills | Status: AC

## 2023-10-24 MED ORDER — ACETAMINOPHEN 325 MG PO TABS
650.0000 mg | ORAL_TABLET | Freq: Once | ORAL | Status: AC
  Administered 2023-10-24: 650 mg via ORAL
  Filled 2023-10-24: qty 2

## 2023-10-24 NOTE — Discharge Instructions (Signed)
Your CT scans and x-rays were normal.  You may use the patches to help with your pain.  Please return for any new, worsening, or change in symptoms or other concerns.  It was a pleasure caring for you today.

## 2023-10-24 NOTE — ED Notes (Signed)
See triage note  Presents s/p fall  States she fell   hitting the wall and the floor  States she hit her head and left hip area

## 2023-10-24 NOTE — ED Triage Notes (Signed)
Pt reports she had a mechanical fall last Wednesday d/t wearing crocs and since then has been having pain to left hip. Pt was able to bear weight on leg. Walks with cane. A/O x 4

## 2023-10-24 NOTE — ED Provider Notes (Signed)
[facility] Provider Note    Event Date/Time   First MD Initiated Contact with Patient 10/24/23 [phone]     (approximate)   History   Hip Pain   HPI  Jackie Tucker is a 89 y.o. female who presents today for evaluation of left hip and low back pain.  Patient reports that 8 days ago she tripped on something on the ground and then hit her left back and hip on the wall.  She denies fall to the ground.  She has been able to ambulate but has had pain since then.  No paresthesias.  No urinary or fecal incontinence or retention.  She has been able to walk.  There are no problems to display for this patient.         Physical Exam   Triage Vital Signs: ED Triage Vitals  Encounter Vitals Group     BP 10/24/23 0939 (!) 149/64     Systolic BP Percentile --      Diastolic BP Percentile --      Pulse Rate 10/24/23 0939 79     Resp 10/24/23 0939 18     Temp 10/24/23 0939 98 F (36.7 C)     Temp Source 10/24/23 0939 Oral     SpO2 10/24/23 0939 94 %     Weight 10/24/23 0940 213 lb 13.5 oz (97 kg)     Height 10/24/23 0940 5\' 4"  (1.626 m)     Head Circumference --      Peak Flow --      Pain Score 10/24/23 0940 9     Pain Loc --      Pain Education --      Exclude from Growth Chart --     Most recent vital signs: Vitals:   10/24/23 0939  BP: (!) 149/64  Pulse: 79  Resp: 18  Temp: 98 F (36.7 C)  SpO2: 94%    Physical Exam Vitals and nursing note reviewed.  Constitutional:      General: Awake and alert. No acute distress.    Appearance: Normal appearance. The patient is obese.  HENT:     Head: Normocephalic and atraumatic.     Mouth: Mucous membranes are moist.  Eyes:     General: PERRL. Normal EOMs        Right eye: No discharge.        Left eye: No discharge.     Conjunctiva/sclera: Conjunctivae normal.  Cardiovascular:     Rate and Rhythm: Normal rate and regular rhythm.     Pulses: Normal pulses.  Pulmonary:     Effort: Pulmonary  effort is normal. No respiratory distress.     Breath sounds: Normal breath sounds.  Abdominal:     Abdomen is soft. There is no abdominal tenderness. No rebound or guarding. No distention. Musculoskeletal:        General: No swelling. Normal range of motion.     Cervical back: Normal range of motion and neck supple.  Pelvis stable.  Able to lift both legs up off of the stretcher.  Negative logroll of the hip.  Tenderness to palpation over the greater trochanter and posterior hip.  No external abnormalities over the left hip. Back: No midline vertebral tenderness. Tenderness to palpation to the lumbar paraspinal muscle area. Strength and sensation 5/5 to bilateral lower extremities. Normal great toe extension against resistance. Normal sensation throughout feet. Normal patellar reflexes. Negative SLR and opposite SLR bilaterally.  Skin:  General: Skin is warm and dry.     Capillary Refill: Capillary refill takes less than 2 seconds.     Findings: No rash.  Neurological:     Mental Status: The patient is awake and alert.      ED Results / Procedures / Treatments   Labs (all labs ordered are listed, but only abnormal results are displayed) Labs Reviewed - No data to display   EKG     RADIOLOGY I independently reviewed and interpreted imaging and agree with radiologists findings.     PROCEDURES:  Critical Care performed:   Procedures   MEDICATIONS ORDERED IN ED: Medications  lidocaine (LIDODERM) 5 % 1 patch (1 patch Transdermal Patch Applied 10/24/23 1048)  acetaminophen (TYLENOL) tablet 650 mg (650 mg Oral Given 10/24/23 1048)     IMPRESSION / MDM / ASSESSMENT AND PLAN / ED COURSE  I reviewed the triage vital signs and the nursing notes.   Differential diagnosis includes, but is not limited to, contusion, hematoma, fracture, dislocation, lumbar radiculopathy.  Patient is awake and alert, hemodynamically stable and afebrile.  She has normal strength and sensation  in her bilateral lower extremities, negative logroll of the hip.  X-ray of her hip obtained in triage is negative.  Given her continued pain, CT pelvis and lumbar spine also obtained which are negative for any acute findings. She has 5 out of 5 strength with intact sensation to extensor hallucis dorsiflexion and plantarflexion of bilateral lower extremities with normal patellar reflexes bilaterally. Most likely etiology at this point is muscle strain vs herniated disc. No red flags to indicate patient is at risk for more auspicious process that would require urgent/emergent spinal imaging or subspecialty evaluation at this time. No major trauma, no midline tenderness, no history or physical exam findings to suggest cauda equina syndrome or spinal cord compression. No focal neurological deficits on exam. No constitutional symptoms or history of immunosuppression or IVDA to suggest potential for epidural abscess. Not anticoagulated, no history of bleeding diastasis to suggest risk for epidural hematoma.  No abdominal pain or flank pain to suggest kidney stone, no history of kidney stone.  No fever or dysuria or CVAT to suggest pyelonephritis.  No chest pain, back pain, shortness of breath, neurological deficits, to suggest vascular catastrophe, and pulses are equal in all 4 extremities.    Patient is reassured by her negative imaging.  She was given a Lidoderm patch and Tylenol with significant improvement of her symptoms.  She requested a prescription for the Lidoderm patches which was sent to her pharmacy for her.  She is able to ambulate with a steady gait.  We discussed return precautions and the importance of close outpatient follow-up.  Patient understands and agrees with plan.  She was discharged in stable condition.   Patient's presentation is most consistent with acute complicated illness / injury requiring diagnostic workup.       FINAL CLINICAL IMPRESSION(S) / ED DIAGNOSES   Final diagnoses:   Left hip pain  Acute right-sided low back pain without sciatica     Rx / DC Orders   ED Discharge Orders          Ordered    lidocaine (LIDODERM) 5 %  Every 12 hours        10/24/23 1249             Note:  This document was prepared using Dragon voice recognition software and may include unintentional dictation errors.   Demia Viera, Herb Grays,  PA-C 10/24/23 1419    Corena Herter, MD 10/24/23 1558

## 2024-03-03 DIAGNOSIS — Z6841 Body Mass Index (BMI) 40.0 and over, adult: Secondary | ICD-10-CM | POA: Diagnosis not present

## 2024-03-03 DIAGNOSIS — E1122 Type 2 diabetes mellitus with diabetic chronic kidney disease: Secondary | ICD-10-CM | POA: Diagnosis not present

## 2024-03-03 DIAGNOSIS — I10 Essential (primary) hypertension: Secondary | ICD-10-CM | POA: Diagnosis not present

## 2024-03-03 DIAGNOSIS — Z8739 Personal history of other diseases of the musculoskeletal system and connective tissue: Secondary | ICD-10-CM | POA: Diagnosis not present

## 2024-03-03 DIAGNOSIS — N1832 Chronic kidney disease, stage 3b: Secondary | ICD-10-CM | POA: Diagnosis not present

## 2024-03-03 DIAGNOSIS — D649 Anemia, unspecified: Secondary | ICD-10-CM | POA: Diagnosis not present

## 2024-03-03 DIAGNOSIS — F3342 Major depressive disorder, recurrent, in full remission: Secondary | ICD-10-CM | POA: Diagnosis not present

## 2024-03-04 DIAGNOSIS — R8290 Unspecified abnormal findings in urine: Secondary | ICD-10-CM | POA: Diagnosis not present

## 2024-03-09 DIAGNOSIS — N1832 Chronic kidney disease, stage 3b: Secondary | ICD-10-CM | POA: Diagnosis not present

## 2024-03-09 DIAGNOSIS — E1122 Type 2 diabetes mellitus with diabetic chronic kidney disease: Secondary | ICD-10-CM | POA: Diagnosis not present

## 2024-03-09 DIAGNOSIS — I10 Essential (primary) hypertension: Secondary | ICD-10-CM | POA: Diagnosis not present

## 2024-03-09 DIAGNOSIS — F3342 Major depressive disorder, recurrent, in full remission: Secondary | ICD-10-CM | POA: Diagnosis not present

## 2024-03-10 DIAGNOSIS — E669 Obesity, unspecified: Secondary | ICD-10-CM | POA: Diagnosis not present

## 2024-03-10 DIAGNOSIS — I129 Hypertensive chronic kidney disease with stage 1 through stage 4 chronic kidney disease, or unspecified chronic kidney disease: Secondary | ICD-10-CM | POA: Diagnosis not present

## 2024-03-10 DIAGNOSIS — F324 Major depressive disorder, single episode, in partial remission: Secondary | ICD-10-CM | POA: Diagnosis not present

## 2024-03-10 DIAGNOSIS — Z6836 Body mass index (BMI) 36.0-36.9, adult: Secondary | ICD-10-CM | POA: Diagnosis not present

## 2024-03-10 DIAGNOSIS — N390 Urinary tract infection, site not specified: Secondary | ICD-10-CM | POA: Diagnosis not present

## 2024-03-10 DIAGNOSIS — E1122 Type 2 diabetes mellitus with diabetic chronic kidney disease: Secondary | ICD-10-CM | POA: Diagnosis not present

## 2024-03-10 DIAGNOSIS — Z9181 History of falling: Secondary | ICD-10-CM | POA: Diagnosis not present

## 2024-03-10 DIAGNOSIS — M79621 Pain in right upper arm: Secondary | ICD-10-CM | POA: Diagnosis not present

## 2024-05-11 DIAGNOSIS — I129 Hypertensive chronic kidney disease with stage 1 through stage 4 chronic kidney disease, or unspecified chronic kidney disease: Secondary | ICD-10-CM | POA: Diagnosis not present

## 2024-05-11 DIAGNOSIS — Z6836 Body mass index (BMI) 36.0-36.9, adult: Secondary | ICD-10-CM | POA: Diagnosis not present

## 2024-05-11 DIAGNOSIS — N390 Urinary tract infection, site not specified: Secondary | ICD-10-CM | POA: Diagnosis not present

## 2024-05-11 DIAGNOSIS — I10 Essential (primary) hypertension: Secondary | ICD-10-CM | POA: Diagnosis not present

## 2024-05-11 DIAGNOSIS — N1832 Chronic kidney disease, stage 3b: Secondary | ICD-10-CM | POA: Diagnosis not present

## 2024-05-11 DIAGNOSIS — F331 Major depressive disorder, recurrent, moderate: Secondary | ICD-10-CM | POA: Diagnosis not present

## 2024-05-11 DIAGNOSIS — E1122 Type 2 diabetes mellitus with diabetic chronic kidney disease: Secondary | ICD-10-CM | POA: Diagnosis not present

## 2024-05-11 DIAGNOSIS — F4321 Adjustment disorder with depressed mood: Secondary | ICD-10-CM | POA: Diagnosis not present

## 2024-05-11 DIAGNOSIS — F411 Generalized anxiety disorder: Secondary | ICD-10-CM | POA: Diagnosis not present

## 2024-05-11 DIAGNOSIS — R251 Tremor, unspecified: Secondary | ICD-10-CM | POA: Diagnosis not present

## 2024-05-29 DIAGNOSIS — Z1331 Encounter for screening for depression: Secondary | ICD-10-CM | POA: Diagnosis not present

## 2024-05-29 DIAGNOSIS — N1831 Chronic kidney disease, stage 3a: Secondary | ICD-10-CM | POA: Diagnosis not present

## 2024-06-17 DIAGNOSIS — R061 Stridor: Secondary | ICD-10-CM | POA: Diagnosis not present

## 2024-06-17 DIAGNOSIS — N183 Chronic kidney disease, stage 3 unspecified: Secondary | ICD-10-CM | POA: Diagnosis not present

## 2024-06-17 DIAGNOSIS — D631 Anemia in chronic kidney disease: Secondary | ICD-10-CM | POA: Diagnosis not present

## 2024-06-17 DIAGNOSIS — E1122 Type 2 diabetes mellitus with diabetic chronic kidney disease: Secondary | ICD-10-CM | POA: Diagnosis not present

## 2024-06-17 DIAGNOSIS — F411 Generalized anxiety disorder: Secondary | ICD-10-CM | POA: Diagnosis not present

## 2024-06-17 DIAGNOSIS — I129 Hypertensive chronic kidney disease with stage 1 through stage 4 chronic kidney disease, or unspecified chronic kidney disease: Secondary | ICD-10-CM | POA: Diagnosis not present

## 2024-06-17 DIAGNOSIS — J45909 Unspecified asthma, uncomplicated: Secondary | ICD-10-CM | POA: Diagnosis not present

## 2024-06-17 DIAGNOSIS — R490 Dysphonia: Secondary | ICD-10-CM | POA: Diagnosis not present

## 2024-06-22 DIAGNOSIS — E213 Hyperparathyroidism, unspecified: Secondary | ICD-10-CM | POA: Diagnosis not present

## 2024-06-30 DIAGNOSIS — E1122 Type 2 diabetes mellitus with diabetic chronic kidney disease: Secondary | ICD-10-CM | POA: Diagnosis not present

## 2024-06-30 DIAGNOSIS — Z6836 Body mass index (BMI) 36.0-36.9, adult: Secondary | ICD-10-CM | POA: Diagnosis not present

## 2024-06-30 DIAGNOSIS — I10 Essential (primary) hypertension: Secondary | ICD-10-CM | POA: Diagnosis not present

## 2024-06-30 DIAGNOSIS — N390 Urinary tract infection, site not specified: Secondary | ICD-10-CM | POA: Diagnosis not present

## 2024-06-30 DIAGNOSIS — N1832 Chronic kidney disease, stage 3b: Secondary | ICD-10-CM | POA: Diagnosis not present

## 2024-06-30 DIAGNOSIS — E213 Hyperparathyroidism, unspecified: Secondary | ICD-10-CM | POA: Diagnosis not present

## 2024-06-30 DIAGNOSIS — F3342 Major depressive disorder, recurrent, in full remission: Secondary | ICD-10-CM | POA: Diagnosis not present

## 2024-06-30 DIAGNOSIS — M79621 Pain in right upper arm: Secondary | ICD-10-CM | POA: Diagnosis not present

## 2024-07-07 DIAGNOSIS — E1122 Type 2 diabetes mellitus with diabetic chronic kidney disease: Secondary | ICD-10-CM | POA: Diagnosis not present

## 2024-07-07 DIAGNOSIS — Z1231 Encounter for screening mammogram for malignant neoplasm of breast: Secondary | ICD-10-CM | POA: Diagnosis not present

## 2024-07-07 DIAGNOSIS — Z0000 Encounter for general adult medical examination without abnormal findings: Secondary | ICD-10-CM | POA: Diagnosis not present

## 2024-07-07 DIAGNOSIS — M545 Low back pain, unspecified: Secondary | ICD-10-CM | POA: Diagnosis not present

## 2024-07-07 DIAGNOSIS — E669 Obesity, unspecified: Secondary | ICD-10-CM | POA: Diagnosis not present

## 2024-07-07 DIAGNOSIS — Z1331 Encounter for screening for depression: Secondary | ICD-10-CM | POA: Diagnosis not present

## 2024-07-07 DIAGNOSIS — Z6836 Body mass index (BMI) 36.0-36.9, adult: Secondary | ICD-10-CM | POA: Diagnosis not present

## 2024-07-07 DIAGNOSIS — F4321 Adjustment disorder with depressed mood: Secondary | ICD-10-CM | POA: Diagnosis not present

## 2024-09-01 DIAGNOSIS — H25813 Combined forms of age-related cataract, bilateral: Secondary | ICD-10-CM | POA: Diagnosis not present

## 2024-09-01 DIAGNOSIS — H5203 Hypermetropia, bilateral: Secondary | ICD-10-CM | POA: Diagnosis not present

## 2024-09-24 DIAGNOSIS — H2513 Age-related nuclear cataract, bilateral: Secondary | ICD-10-CM | POA: Diagnosis not present
